# Patient Record
Sex: Female | Born: 1935
Health system: Southern US, Community
[De-identification: ages and names within clinical notes are randomized; demographics above are authoritative.]

## PROBLEM LIST (undated history)

## (undated) DIAGNOSIS — G8929 Other chronic pain: Secondary | ICD-10-CM

## (undated) DIAGNOSIS — M545 Other chronic pain: Secondary | ICD-10-CM

## (undated) DIAGNOSIS — M5430 Sciatica, unspecified side: Secondary | ICD-10-CM

---

## 2014-01-23 DIAGNOSIS — Z1231 Encounter for screening mammogram for malignant neoplasm of breast: Secondary | ICD-10-CM | POA: Diagnosis not present

## 2014-01-23 DIAGNOSIS — R920 Mammographic microcalcification found on diagnostic imaging of breast: Secondary | ICD-10-CM | POA: Diagnosis not present

## 2014-01-23 DIAGNOSIS — R922 Inconclusive mammogram: Secondary | ICD-10-CM | POA: Diagnosis not present

## 2014-06-06 DIAGNOSIS — Z961 Presence of intraocular lens: Secondary | ICD-10-CM | POA: Diagnosis not present

## 2014-06-06 DIAGNOSIS — H43811 Vitreous degeneration, right eye: Secondary | ICD-10-CM | POA: Diagnosis not present

## 2014-11-05 DIAGNOSIS — M810 Age-related osteoporosis without current pathological fracture: Secondary | ICD-10-CM | POA: Diagnosis not present

## 2014-11-05 DIAGNOSIS — Z0000 Encounter for general adult medical examination without abnormal findings: Secondary | ICD-10-CM | POA: Diagnosis not present

## 2014-11-05 DIAGNOSIS — Z681 Body mass index (BMI) 19 or less, adult: Secondary | ICD-10-CM | POA: Diagnosis not present

## 2014-11-05 DIAGNOSIS — E7800 Pure hypercholesterolemia, unspecified: Secondary | ICD-10-CM | POA: Diagnosis not present

## 2014-11-05 DIAGNOSIS — Z23 Encounter for immunization: Secondary | ICD-10-CM | POA: Diagnosis not present

## 2014-11-05 DIAGNOSIS — R030 Elevated blood-pressure reading, without diagnosis of hypertension: Secondary | ICD-10-CM | POA: Diagnosis not present

## 2014-12-17 DIAGNOSIS — Z961 Presence of intraocular lens: Secondary | ICD-10-CM | POA: Diagnosis not present

## 2014-12-17 DIAGNOSIS — H43811 Vitreous degeneration, right eye: Secondary | ICD-10-CM | POA: Diagnosis not present

## 2015-06-17 DIAGNOSIS — Z961 Presence of intraocular lens: Secondary | ICD-10-CM | POA: Diagnosis not present

## 2015-06-17 DIAGNOSIS — H43811 Vitreous degeneration, right eye: Secondary | ICD-10-CM | POA: Diagnosis not present

## 2015-10-29 DIAGNOSIS — Z23 Encounter for immunization: Secondary | ICD-10-CM | POA: Diagnosis not present

## 2015-11-06 DIAGNOSIS — J019 Acute sinusitis, unspecified: Secondary | ICD-10-CM | POA: Diagnosis not present

## 2015-11-06 DIAGNOSIS — J3089 Other allergic rhinitis: Secondary | ICD-10-CM | POA: Diagnosis not present

## 2016-03-23 DIAGNOSIS — E785 Hyperlipidemia, unspecified: Secondary | ICD-10-CM | POA: Diagnosis not present

## 2016-03-23 DIAGNOSIS — Z1211 Encounter for screening for malignant neoplasm of colon: Secondary | ICD-10-CM | POA: Diagnosis not present

## 2016-03-23 DIAGNOSIS — M810 Age-related osteoporosis without current pathological fracture: Secondary | ICD-10-CM | POA: Diagnosis not present

## 2016-03-23 DIAGNOSIS — Z1389 Encounter for screening for other disorder: Secondary | ICD-10-CM | POA: Diagnosis not present

## 2016-03-23 DIAGNOSIS — Z0000 Encounter for general adult medical examination without abnormal findings: Secondary | ICD-10-CM | POA: Diagnosis not present

## 2016-04-02 DIAGNOSIS — Z1211 Encounter for screening for malignant neoplasm of colon: Secondary | ICD-10-CM | POA: Diagnosis not present

## 2016-04-05 DIAGNOSIS — M9905 Segmental and somatic dysfunction of pelvic region: Secondary | ICD-10-CM | POA: Diagnosis not present

## 2016-04-05 DIAGNOSIS — M5441 Lumbago with sciatica, right side: Secondary | ICD-10-CM | POA: Diagnosis not present

## 2016-04-05 DIAGNOSIS — M25551 Pain in right hip: Secondary | ICD-10-CM | POA: Diagnosis not present

## 2016-04-05 DIAGNOSIS — M9904 Segmental and somatic dysfunction of sacral region: Secondary | ICD-10-CM | POA: Diagnosis not present

## 2016-04-05 DIAGNOSIS — M9903 Segmental and somatic dysfunction of lumbar region: Secondary | ICD-10-CM | POA: Diagnosis not present

## 2016-05-07 DIAGNOSIS — M79604 Pain in right leg: Secondary | ICD-10-CM | POA: Diagnosis not present

## 2016-05-07 DIAGNOSIS — M791 Myalgia: Secondary | ICD-10-CM | POA: Diagnosis not present

## 2016-05-15 ENCOUNTER — Observation Stay (HOSPITAL_COMMUNITY): Payer: Medicare Other

## 2016-05-15 ENCOUNTER — Other Ambulatory Visit (HOSPITAL_COMMUNITY): Payer: Medicare Other

## 2016-05-15 ENCOUNTER — Encounter (HOSPITAL_COMMUNITY): Payer: Self-pay | Admitting: Emergency Medicine

## 2016-05-15 ENCOUNTER — Emergency Department (HOSPITAL_COMMUNITY): Payer: Medicare Other

## 2016-05-15 ENCOUNTER — Observation Stay (HOSPITAL_COMMUNITY)
Admission: EM | Admit: 2016-05-15 | Discharge: 2016-05-16 | Disposition: A | Discharge status: Home / Self Care | Payer: Medicare Other | Attending: Nephrology | Admitting: Nephrology

## 2016-05-15 DIAGNOSIS — M545 Low back pain: Secondary | ICD-10-CM | POA: Insufficient documentation

## 2016-05-15 DIAGNOSIS — R4182 Altered mental status, unspecified: Secondary | ICD-10-CM | POA: Insufficient documentation

## 2016-05-15 DIAGNOSIS — E871 Hypo-osmolality and hyponatremia: Secondary | ICD-10-CM | POA: Diagnosis present

## 2016-05-15 DIAGNOSIS — R030 Elevated blood-pressure reading, without diagnosis of hypertension: Secondary | ICD-10-CM | POA: Diagnosis not present

## 2016-05-15 DIAGNOSIS — M5430 Sciatica, unspecified side: Secondary | ICD-10-CM

## 2016-05-15 DIAGNOSIS — Z882 Allergy status to sulfonamides status: Secondary | ICD-10-CM | POA: Diagnosis not present

## 2016-05-15 DIAGNOSIS — G8929 Other chronic pain: Secondary | ICD-10-CM | POA: Diagnosis not present

## 2016-05-15 DIAGNOSIS — I6621 Occlusion and stenosis of right posterior cerebral artery: Secondary | ICD-10-CM | POA: Diagnosis not present

## 2016-05-15 DIAGNOSIS — R4781 Slurred speech: Secondary | ICD-10-CM | POA: Diagnosis not present

## 2016-05-15 DIAGNOSIS — Z7982 Long term (current) use of aspirin: Secondary | ICD-10-CM | POA: Insufficient documentation

## 2016-05-15 DIAGNOSIS — G459 Transient cerebral ischemic attack, unspecified: Secondary | ICD-10-CM | POA: Diagnosis not present

## 2016-05-15 DIAGNOSIS — Z87891 Personal history of nicotine dependence: Secondary | ICD-10-CM | POA: Insufficient documentation

## 2016-05-15 DIAGNOSIS — I10 Essential (primary) hypertension: Secondary | ICD-10-CM | POA: Diagnosis present

## 2016-05-15 DIAGNOSIS — E785 Hyperlipidemia, unspecified: Secondary | ICD-10-CM | POA: Diagnosis not present

## 2016-05-15 DIAGNOSIS — R4701 Aphasia: Secondary | ICD-10-CM | POA: Diagnosis not present

## 2016-05-15 DIAGNOSIS — I639 Cerebral infarction, unspecified: Secondary | ICD-10-CM

## 2016-05-15 DIAGNOSIS — R29818 Other symptoms and signs involving the nervous system: Secondary | ICD-10-CM | POA: Diagnosis not present

## 2016-05-15 HISTORY — DX: Low back pain: M54.5

## 2016-05-15 HISTORY — DX: Sciatica, unspecified side: M54.30

## 2016-05-15 HISTORY — DX: Other chronic pain: M54.50

## 2016-05-15 HISTORY — DX: Other chronic pain: G89.29

## 2016-05-15 LAB — COMPREHENSIVE METABOLIC PANEL
ALT: 12 U/L — AB (ref 14–54)
ANION GAP: 12 (ref 5–15)
AST: 23 U/L (ref 15–41)
Albumin: 4.3 g/dL (ref 3.5–5.0)
Alkaline Phosphatase: 87 U/L (ref 38–126)
BUN: 11 mg/dL (ref 6–20)
CHLORIDE: 96 mmol/L — AB (ref 101–111)
CO2: 23 mmol/L (ref 22–32)
Calcium: 9.4 mg/dL (ref 8.9–10.3)
Creatinine, Ser: 0.89 mg/dL (ref 0.44–1.00)
Glucose, Bld: 97 mg/dL (ref 65–99)
POTASSIUM: 4 mmol/L (ref 3.5–5.1)
SODIUM: 131 mmol/L — AB (ref 135–145)
Total Bilirubin: 0.4 mg/dL (ref 0.3–1.2)
Total Protein: 7.1 g/dL (ref 6.5–8.1)

## 2016-05-15 LAB — APTT: aPTT: 31 seconds (ref 24–36)

## 2016-05-15 LAB — CBC
HEMATOCRIT: 38.5 % (ref 36.0–46.0)
Hemoglobin: 13.2 g/dL (ref 12.0–15.0)
MCH: 32.1 pg (ref 26.0–34.0)
MCHC: 34.3 g/dL (ref 30.0–36.0)
MCV: 93.7 fL (ref 78.0–100.0)
PLATELETS: 310 10*3/uL (ref 150–400)
RBC: 4.11 MIL/uL (ref 3.87–5.11)
RDW: 14 % (ref 11.5–15.5)
WBC: 7.9 10*3/uL (ref 4.0–10.5)

## 2016-05-15 LAB — I-STAT CHEM 8, ED
BUN: 13 mg/dL (ref 6–20)
CALCIUM ION: 1.06 mmol/L — AB (ref 1.15–1.40)
CHLORIDE: 96 mmol/L — AB (ref 101–111)
Creatinine, Ser: 0.9 mg/dL (ref 0.44–1.00)
GLUCOSE: 97 mg/dL (ref 65–99)
HCT: 41 % (ref 36.0–46.0)
Hemoglobin: 13.9 g/dL (ref 12.0–15.0)
Potassium: 3.9 mmol/L (ref 3.5–5.1)
Sodium: 130 mmol/L — ABNORMAL LOW (ref 135–145)
TCO2: 27 mmol/L (ref 0–100)

## 2016-05-15 LAB — LIPID PANEL
Cholesterol: 269 mg/dL — ABNORMAL HIGH (ref 0–200)
HDL: 76 mg/dL (ref >40)
LDL CALC: 169 mg/dL — AB (ref 0–99)
Total CHOL/HDL Ratio: 3.5 RATIO
Triglycerides: 120 mg/dL (ref <150)
VLDL: 24 mg/dL (ref 0–40)

## 2016-05-15 LAB — DIFFERENTIAL
BASOS PCT: 1 %
Basophils Absolute: 0 10*3/uL (ref 0.0–0.1)
EOS ABS: 0.1 10*3/uL (ref 0.0–0.7)
EOS PCT: 2 %
Lymphocytes Relative: 30 %
Lymphs Abs: 2.4 10*3/uL (ref 0.7–4.0)
MONO ABS: 0.6 10*3/uL (ref 0.1–1.0)
MONOS PCT: 8 %
Neutro Abs: 4.7 10*3/uL (ref 1.7–7.7)
Neutrophils Relative %: 59 %

## 2016-05-15 LAB — PROTIME-INR
INR: 1.02
PROTHROMBIN TIME: 13.4 s (ref 11.4–15.2)

## 2016-05-15 LAB — I-STAT TROPONIN, ED: TROPONIN I, POC: 0 ng/mL (ref 0.00–0.08)

## 2016-05-15 LAB — CBG MONITORING, ED: Glucose-Capillary: 96 mg/dL (ref 65–99)

## 2016-05-15 MED ORDER — SENNOSIDES-DOCUSATE SODIUM 8.6-50 MG PO TABS
1.0000 | ORAL_TABLET | Freq: Every evening | ORAL | Status: DC | PRN
  Filled 2016-05-15: qty 1

## 2016-05-15 MED ORDER — ACETAMINOPHEN 325 MG PO TABS
650.0000 mg | ORAL_TABLET | ORAL | Status: DC | PRN
  Administered 2016-05-16: 650 mg via ORAL
  Filled 2016-05-15: qty 2

## 2016-05-15 MED ORDER — ASPIRIN 300 MG RE SUPP: 300.0000 mg | Freq: Every day | RECTAL | Status: DC

## 2016-05-15 MED ORDER — ENOXAPARIN SODIUM 40 MG/0.4ML ~~LOC~~ SOLN
40.0000 mg | SUBCUTANEOUS | Status: DC
  Administered 2016-05-15: 40 mg via SUBCUTANEOUS
  Filled 2016-05-15 (×2): qty 0.4

## 2016-05-15 MED ORDER — ACETAMINOPHEN 650 MG RE SUPP: 650.0000 mg | RECTAL | Status: DC | PRN

## 2016-05-15 MED ORDER — ACETAMINOPHEN 160 MG/5ML PO SOLN: 650.0000 mg | ORAL | Status: DC | PRN

## 2016-05-15 MED ORDER — SODIUM CHLORIDE 0.9 % IV SOLN
INTRAVENOUS | Status: AC
  Administered 2016-05-15: 15:00:00 via INTRAVENOUS

## 2016-05-15 MED ORDER — STROKE: EARLY STAGES OF RECOVERY BOOK
Freq: Once | Status: DC
  Filled 2016-05-15: qty 1

## 2016-05-15 MED ORDER — ASPIRIN 325 MG PO TABS
325.0000 mg | ORAL_TABLET | Freq: Every day | ORAL | Status: DC
  Administered 2016-05-15 – 2016-05-16 (×2): 325 mg via ORAL
  Filled 2016-05-15 (×2): qty 1

## 2016-05-15 NOTE — Consult Note (Signed)
Neurology Consultation Reason for Consult: Aphasia Referring Physician: Rudean Haskell  CC: Aphasia  History is obtained from: Patient  HPI: Erica House is a 81 y.o. female who was reading a book round 11:30. She then noticed that she was having difficulty with reading. She knows the 23rd psalm by heart and tried to recite it and felt that she knew what needed to come next but was unable to get the words out. Due to this, she called 911 and had difficulty with speaking. This has been steadily improving since that time, and she feels like she is almost back to baseline at this point.   LKW: 11:30 AM tpa given?: no, rapidly improving mild symptoms  ROS: A 14 point ROS was performed and is negative except as noted in the HPI.   Past Medical History:  Diagnosis Date  . Chronic bilateral low back pain   . Sciatica      History reviewed. No pertinent family history.   Social History:  reports that she has quit smoking. She has quit using smokeless tobacco. She reports that she does not drink alcohol or use drugs.   Exam: Current vital signs: BP (!) 188/82   Pulse 72   Temp 97.8 F (36.6 C)   Resp 17   Ht 5\' 2"  (1.575 m)   Wt 49.7 kg (109 lb 8 oz)   SpO2 100%   BMI 20.03 kg/m  Vital signs in last 24 hours: Temp:  [97.8 F (36.6 C)] 97.8 F (36.6 C) (04/28 1403) Pulse Rate:  [68-82] 72 (04/28 1430) Resp:  [15-20] 17 (04/28 1430) BP: (152-188)/(75-87) 188/82 (04/28 1430) SpO2:  [98 %-100 %] 100 % (04/28 1430) Weight:  [49.7 kg (109 lb 8 oz)] 49.7 kg (109 lb 8 oz) (04/28 1328)   Physical Exam  Constitutional: Appears well-developed and well-nourished.  Psych: Affect appropriate to situation Eyes: No scleral injection HENT: No OP obstrucion Head: Normocephalic.  Cardiovascular: Normal rate and regular rhythm.  Respiratory: Effort normal and breath sounds normal to anterior ascultation GI: Soft.  No distension. There is no tenderness.  Skin: WDI  Neuro: Mental  Status: Patient is awake, alert, oriented to person, place, month, year, and situation. Patient is able to give a clear and coherent history. No signs of aphasia or neglect Cranial Nerves: II: Visual Fields are full. Pupils are equal, round, and reactive to light.   III,IV, VI: EOMI without ptosis or diploplia.  V: Facial sensation is symmetric to temperature VII: Facial movement is symmetric.  VIII: hearing is intact to voice X: Uvula elevates symmetrically XI: Shoulder shrug is symmetric. XII: tongue is midline without atrophy or fasciculations.  Motor: Tone is normal. Bulk is normal. 5/5 strength was present in all four extremities.  Sensory: Sensation is symmetric to light touch and temperature in the arms and legs. Cerebellar: FNF intact bilaterally  I have reviewed labs in epic and the results pertinent to this consultation are: Sodium 131  I have reviewed the images obtained: CT head-unremarkable  Impression: 81 year old female with transient difficulty reading, aphasia. The character of the symptoms are highly concerning for a transient ischemic attack and I would favor admitting her for workup for such.  Recommendations: 1. HgbA1c, fasting lipid panel 2. MRI, MRA  of the brain without contrast 3. Frequent neuro checks 4. Echocardiogram 5. Carotid dopplers 6. Prophylactic therapy-Antiplatelet med: Aspirin - dose 325mg  PO or 300mg  PR 7. Risk factor modification 8. Telemetry monitoring 9. PT consult, OT consult, Speech consult 10.  please page stroke NP  Or  PA  Or MD  from 8am -4 pm as this patient will be followed by the stroke team at this point.   You can look them up on [email or web address]      Roland Rack, MD [facility] [phone]  If 7pm- 7am, please page neurology on call as listed in [place].

## 2016-05-15 NOTE — ED Provider Notes (Signed)
[facility] DEPT Provider Note   CSN: [id number] Arrival date & time: 05/15/16  1304     History   Chief Complaint Chief Complaint  Patient presents with  . Code Stroke    HPI Kimora Stankovic is a 81 y.o. female.  The history is provided by the patient and a relative. No language interpreter was used.   Mateja Dier is a 81 y.o. female who presents to the Emergency Department complaining of Code stroke. History is provided by patient, family, EMS. She was in her routine state of health this morning.  She was last seen normal at 11:30 this morning. She went to try and read something and had difficulty understanding how to read and had difficulty speaking as well. EMS was called for concern of stroke. She had no weakness but did feel tingling all over. She feels like she may develop a headache but does not currently have a headache. No prior similar symptoms. No history of TIA or CVA.  Overall her speech and reading difficulties have improved.  Past Medical History:  Diagnosis Date  . Chronic bilateral low back pain   . Sciatica     Patient Active Problem List   Diagnosis Date Noted  . TIA (transient ischemic attack) 05/15/2016  . Hyponatremia 05/15/2016  . Hypertension 05/15/2016  . Sciatica     History reviewed. No pertinent surgical history.  OB History    No data available       Home Medications    Prior to Admission medications   Medication Sig Start Date End Date Taking? Authorizing Provider  acetaminophen (TYLENOL) 325 MG tablet Take 650 mg by mouth every 6 (six) hours as needed for mild pain.   Yes Historical Provider, MD  Ascorbic Acid (VITAMIN C) 1000 MG tablet Take 1,000 mg by mouth daily.   Yes Historical Provider, MD  Biotin 5000 MCG CAPS Take 5,000 mcg by mouth daily.   Yes Historical Provider, MD  CALCIUM-VITAMIN D PO Take 1 tablet by mouth daily.   Yes Historical Provider, MD  docusate sodium (COLACE) 100 MG capsule Take 100 mg by mouth at bedtime.    Yes Historical Provider, MD  loratadine (CLARITIN) 10 MG tablet Take 10 mg by mouth daily.   Yes Historical Provider, MD  naproxen sodium (ANAPROX) 220 MG tablet Take 220 mg by mouth 2 (two) times daily as needed (pain).   Yes Historical Provider, MD  vitamin E 400 UNIT capsule Take 400 Units by mouth daily.   Yes Historical Provider, MD    Family History History reviewed. No pertinent family history.  Social History Social History  Substance Use Topics  . Smoking status: Former Research scientist (life sciences)  . Smokeless tobacco: Former Systems developer  . Alcohol use No     Allergies   Azithromycin; Erythromycin; Macrodantin [nitrofurantoin macrocrystal]; and Sulfa antibiotics   Review of Systems Review of Systems  All other systems reviewed and are negative.    Physical Exam Updated Vital Signs BP (!) 188/82   Pulse 72   Temp 97.8 F (36.6 C)   Resp 17   Ht 5\' 2"  (1.575 m)   Wt 109 lb 8 oz (49.7 kg)   SpO2 100%   BMI 20.03 kg/m   Physical Exam  Constitutional: She is oriented to person, place, and time. She appears well-developed and well-nourished.  HENT:  Head: Normocephalic and atraumatic.  Eyes: EOM are normal. Pupils are equal, round, and reactive to light.  Cardiovascular: Normal rate and regular rhythm.  No murmur heard. Pulmonary/Chest: Effort normal and breath sounds normal. No respiratory distress.  Abdominal: Soft. There is no tenderness. There is no rebound and no guarding.  Musculoskeletal: She exhibits no edema or tenderness.  Neurological: She is alert and oriented to person, place, and time. No cranial nerve deficit. Coordination normal.  5 out of 5 strength in all 4 extremities with sensation light touch intact in all 4 extremities. No pronator drift.  Skin: Skin is warm and dry.  Psychiatric: She has a normal mood and affect. Her behavior is normal.  Nursing note and vitals reviewed.    ED Treatments / Results  Labs (all labs ordered are listed, but only abnormal  results are displayed) Labs Reviewed  COMPREHENSIVE METABOLIC PANEL - Abnormal; Notable for the following:       Result Value   Sodium 131 (*)    Chloride 96 (*)    ALT 12 (*)    All other components within normal limits  LIPID PANEL - Abnormal; Notable for the following:    Cholesterol 269 (*)    LDL Cholesterol 169 (*)    All other components within normal limits  I-STAT CHEM 8, ED - Abnormal; Notable for the following:    Sodium 130 (*)    Chloride 96 (*)    Calcium, Ion 1.06 (*)    All other components within normal limits  PROTIME-INR  APTT  CBC  DIFFERENTIAL  HEMOGLOBIN A1C  I-STAT TROPOININ, ED  CBG MONITORING, ED    EKG  EKG Interpretation None       Radiology Mr Brain Wo Contrast  Result Date: 05/15/2016 CLINICAL DATA:  Acute onset of hand weakness and speech disturbance. Negative CT. EXAM: MRI HEAD WITHOUT CONTRAST MRA HEAD WITHOUT CONTRAST TECHNIQUE: Multiplanar, multiecho pulse sequences of the brain and surrounding structures were obtained without intravenous contrast. Angiographic images of the head were obtained using MRA technique without contrast. COMPARISON:  CT same day FINDINGS: MRI HEAD FINDINGS Brain: Diffusion imaging does not show any acute or subacute infarction. The brainstem and cerebellum are normal. Cerebral hemispheres show moderate chronic small-vessel ischemic changes of the deep and subcortical white matter. No cortical or large vessel territory infarction. No mass lesion, hemorrhage, hydrocephalus or extra-axial collection. Vascular: Major vessels at the base of the brain show flow. Skull and upper cervical spine: Negative Sinuses/Orbits: Clear/ normal.  Previous intra-ocular lens implants. Other: None MRA HEAD FINDINGS Both internal carotid artery's are widely patent through the skullbase and siphon regions. No stenosis. The anterior and middle cerebral vessels are patent without proximal stenosis, aneurysm or vascular malformation. More distal  MCA branches show some atherosclerotic irregularity. Both vertebral arteries are widely patent to the basilar. No basilar stenosis. There is 50% stenosis of the right P1 segment. More distal PCA branches show atherosclerotic narrowing and irregularity. IMPRESSION: No acute infarction. Moderate chronic small-vessel ischemic changes of the cerebral hemispheric white matter. 50% right P1 stenosis. Distal vessel atherosclerotic irregularity particularly evident in the PCA more than MCA territories. Electronically Signed   By: Nelson Chimes M.D.   On: 05/15/2016 15:53   Mr Jodene Nam Head/brain PI Cm  Result Date: 05/15/2016 CLINICAL DATA:  Acute onset of hand weakness and speech disturbance. Negative CT. EXAM: MRI HEAD WITHOUT CONTRAST MRA HEAD WITHOUT CONTRAST TECHNIQUE: Multiplanar, multiecho pulse sequences of the brain and surrounding structures were obtained without intravenous contrast. Angiographic images of the head were obtained using MRA technique without contrast. COMPARISON:  CT same day FINDINGS: MRI HEAD  FINDINGS Brain: Diffusion imaging does not show any acute or subacute infarction. The brainstem and cerebellum are normal. Cerebral hemispheres show moderate chronic small-vessel ischemic changes of the deep and subcortical white matter. No cortical or large vessel territory infarction. No mass lesion, hemorrhage, hydrocephalus or extra-axial collection. Vascular: Major vessels at the base of the brain show flow. Skull and upper cervical spine: Negative Sinuses/Orbits: Clear/ normal.  Previous intra-ocular lens implants. Other: None MRA HEAD FINDINGS Both internal carotid artery's are widely patent through the skullbase and siphon regions. No stenosis. The anterior and middle cerebral vessels are patent without proximal stenosis, aneurysm or vascular malformation. More distal MCA branches show some atherosclerotic irregularity. Both vertebral arteries are widely patent to the basilar. No basilar stenosis.  There is 50% stenosis of the right P1 segment. More distal PCA branches show atherosclerotic narrowing and irregularity. IMPRESSION: No acute infarction. Moderate chronic small-vessel ischemic changes of the cerebral hemispheric white matter. 50% right P1 stenosis. Distal vessel atherosclerotic irregularity particularly evident in the PCA more than MCA territories. Electronically Signed   By: Nelson Chimes M.D.   On: 05/15/2016 15:53   Ct Head Code Stroke W/o Cm  Result Date: 05/15/2016 CLINICAL DATA:  Code stroke.  Aphasia EXAM: CT HEAD WITHOUT CONTRAST TECHNIQUE: Contiguous axial images were obtained from the base of the skull through the vertex without intravenous contrast. COMPARISON:  None. FINDINGS: Brain: No evidence of acute infarction, hemorrhage, hydrocephalus, extra-axial collection or mass lesion/mass effect. Mild atrophy, not unexpected for age. Patchy hypoattenuation of white matter, likely small vessel disease. Vascular: Vascular calcification affecting the skull base carotid arteries and distal vertebrals. No signs of emergent large vessel occlusion. Skull: Normal. Negative for fracture or focal lesion. Sinuses/Orbits: No acute finding. Other: None. ASPECTS [facility] Stroke Program Early CT Score) - Ganglionic level infarction (caudate, lentiform nuclei, internal capsule, insula, M1-M3 cortex): 7 - Supraganglionic infarction (M4-M6 cortex): 3 Total score (0-10 with 10 being normal): 10 IMPRESSION: 1. Mild atrophy.  No acute intracranial findings. 2. ASPECTS is 10. These results were called by telephone at the time of interpretation on 05/15/2016 at 1:24 pm to Dr. Leonel Ramsay , who verbally acknowledged these results. Electronically Signed   By: Staci Righter M.D.   On: 05/15/2016 13:27    Procedures Procedures (including critical care time)  Medications Ordered in ED Medications   stroke: mapping our early stages of recovery book (not administered)  0.9 %  sodium chloride infusion (not  administered)  acetaminophen (TYLENOL) tablet 650 mg (not administered)    Or  acetaminophen (TYLENOL) solution 650 mg (not administered)    Or  acetaminophen (TYLENOL) suppository 650 mg (not administered)  senna-docusate (Senokot-S) tablet 1 tablet (not administered)  enoxaparin (LOVENOX) injection 40 mg (not administered)  aspirin suppository 300 mg (not administered)    Or  aspirin tablet 325 mg (not administered)     Initial Impression / Assessment and Plan / ED Course  I have reviewed the triage vital signs and the nursing notes.  Pertinent labs & imaging results that were available during my care of the patient were reviewed by me and considered in my medical decision making (see chart for details).     Patient presented as a code stroke for expressive aphasia. On examination in the emergency department her symptoms have improved/resolved. She has been a violated by neurology. She is not a TPA candidate given the rapid resolution of her symptoms. Plan to admit for observation for further workup of TIA. Hospitalist consulted  for admission.  Final Clinical Impressions(s) / ED Diagnoses   Final diagnoses:  Transient cerebral ischemia, unspecified type    New Prescriptions New Prescriptions   No medications on file     Quintella Reichert, MD 05/16/16 1333

## 2016-05-15 NOTE — Progress Notes (Signed)
Arrived from ED via wheelchair; just completed MRI; patient is alert and oriented; daughter is at bedside; oriented patient and family member to unit and unit routine.

## 2016-05-15 NOTE — ED Notes (Signed)
Got patient undressed on the monitor patient family is at bedside patient is resting

## 2016-05-15 NOTE — ED Notes (Signed)
Checked patient cbg it was 47 let RN know about the blood sugar

## 2016-05-15 NOTE — H&P (Signed)
History and Physical    Erica House WUX:[id number] DOB: [birth year] DOA: 05/15/2016  PCP: Pcp Not In System Patient coming from: home  Chief Complaint: tia  HPI: Erica House is a 81 y.o. female with medical history significant for chronic low back pain presents to the emergency Department chief complaint altered mental status. Initial evaluation/presentation concerning for TIA.  Information is obtained from the patient and the daughter who is at the bedside. She was in her usual state of health until this morning she suddenly states she was trying to reach for something and was unable to grab it. She also indicates she was reading the paper and had difficulty understanding it. She tried to speak and she reports the words were "in there but I couldn't get them out". EMS was called and code stroke was initiated. She denies headache dizziness visual disturbances numbness or weakness. She does describe a generalized tingling all over. She denies chest pain palpitation shortness of breath diaphoresis. She denies abdominal pain nausea vomiting diarrhea constipation melena. She denies dysuria hematuria frequency or urgency. She denies fever chills recent travel or sick contacts.   ED Course: In the emergency department she's afebrile hemodynamically stable and not hypoxic. She is evaluated by neurology recommends admission for TIA workup  Review of Systems: As per HPI otherwise 10 point review of systems negative.   Ambulatory Status: Ambulates independently no recent falls  Past Medical History:  Diagnosis Date  . Chronic bilateral low back pain   . Sciatica     History reviewed. No pertinent surgical history.  Social History   Social History  . Marital status: Widowed    Spouse name: N/A  . Number of children: N/A  . Years of education: N/A   Occupational History  . Not on file.   Social History Main Topics  . Smoking status: Former Research scientist (life sciences)  . Smokeless tobacco: Former Systems developer  .  Alcohol use No  . Drug use: No  . Sexual activity: Not on file   Other Topics Concern  . Not on file   Social History Narrative  . No narrative on file   She lives at home alone. She is retired Agricultural consultant. Allergies  Allergen Reactions  . Azithromycin Diarrhea  . Erythromycin Diarrhea  . Macrodantin [Nitrofurantoin Macrocrystal] Diarrhea  . Sulfa Antibiotics Diarrhea    History reviewed. No pertinent family history.  Prior to Admission medications   Medication Sig Start Date End Date Taking? Authorizing Provider  acetaminophen (TYLENOL) 325 MG tablet Take 650 mg by mouth every 6 (six) hours as needed for mild pain.   Yes Historical Provider, MD  Ascorbic Acid (VITAMIN C) 1000 MG tablet Take 1,000 mg by mouth daily.   Yes Historical Provider, MD  Biotin 5000 MCG CAPS Take 5,000 mcg by mouth daily.   Yes Historical Provider, MD  CALCIUM-VITAMIN D PO Take 1 tablet by mouth daily.   Yes Historical Provider, MD  docusate sodium (COLACE) 100 MG capsule Take 100 mg by mouth at bedtime.   Yes Historical Provider, MD  loratadine (CLARITIN) 10 MG tablet Take 10 mg by mouth daily.   Yes Historical Provider, MD  naproxen sodium (ANAPROX) 220 MG tablet Take 220 mg by mouth 2 (two) times daily as needed (pain).   Yes Historical Provider, MD  vitamin E 400 UNIT capsule Take 400 Units by mouth daily.   Yes Historical Provider, MD    Physical Exam: Vitals:   05/15/16 1400 05/15/16 1403 05/15/16 1415 05/15/16 1430  BP: (!) 184/79  (!) 183/75 (!) 188/82  Pulse: 74  68 72  Resp: 15  18 17   Temp:  97.8 F (36.6 C)    TempSrc:      SpO2: 100%  98% 100%  Weight:      Height:         General:  Appears calm and comfortable No acute distress Eyes:  PERRL, EOMI, normal lids, iris ENT:  grossly normal hearing, lips & tongue, mucous membranes of her mouth are pink slightly dry Neck:  no LAD, masses or thyromegaly Cardiovascular:  RRR, no m/r/g. No LE edema. Pedal pulses present and  palpable Respiratory:  CTA bilaterally, no w/r/r. Normal respiratory effort. Abdomen:  soft, ntnd, positive bowel sounds throughout Skin:  no rash or induration seen on limited exam Musculoskeletal:  grossly normal tone BUE/BLE, good ROM, no bony abnormality Psychiatric:  grossly normal mood and affect, speech fluent and appropriate, AOx3 Neurologic:  CN 2-12 grossly intact, moves all extremities in coordinated fashion, sensation intact alert and oriented 3 bilateral grip 5 out of 5 lower extremity strength bilaterally 5 out of 5 no pronator drift tongue midline speech clear  Labs on Admission: I have personally reviewed following labs and imaging studies  CBC:  Recent Labs Lab 05/15/16 1307 05/15/16 1313  WBC 7.9  --   NEUTROABS 4.7  --   HGB 13.2 13.9  HCT 38.5 41.0  MCV 93.7  --   PLT 310  --    Basic Metabolic Panel:  Recent Labs Lab 05/15/16 1307 05/15/16 1313  NA 131* 130*  K 4.0 3.9  CL 96* 96*  CO2 23  --   GLUCOSE 97 97  BUN 11 13  CREATININE 0.89 0.90  CALCIUM 9.4  --    GFR: Estimated Creatinine Clearance: 40.4 mL/min (by C-G formula based on SCr of 0.9 mg/dL). Liver Function Tests:  Recent Labs Lab 05/15/16 1307  AST 23  ALT 12*  ALKPHOS 87  BILITOT 0.4  PROT 7.1  ALBUMIN 4.3   No results for input(s): LIPASE, AMYLASE in the last 168 hours. No results for input(s): AMMONIA in the last 168 hours. Coagulation Profile:  Recent Labs Lab 05/15/16 1307  INR 1.02   Cardiac Enzymes: No results for input(s): CKTOTAL, CKMB, CKMBINDEX, TROPONINI in the last 168 hours. BNP (last 3 results) No results for input(s): PROBNP in the last 8760 hours. HbA1C: No results for input(s): HGBA1C in the last 72 hours. CBG:  Recent Labs Lab 05/15/16 1327  GLUCAP 96   Lipid Profile: No results for input(s): CHOL, HDL, LDLCALC, TRIG, CHOLHDL, LDLDIRECT in the last 72 hours. Thyroid Function Tests: No results for input(s): TSH, T4TOTAL, FREET4, T3FREE,  THYROIDAB in the last 72 hours. Anemia Panel: No results for input(s): VITAMINB12, FOLATE, FERRITIN, TIBC, IRON, RETICCTPCT in the last 72 hours. Urine analysis: No results found for: COLORURINE, APPEARANCEUR, LABSPEC, PHURINE, GLUCOSEU, HGBUR, BILIRUBINUR, KETONESUR, PROTEINUR, UROBILINOGEN, NITRITE, LEUKOCYTESUR  Creatinine Clearance: Estimated Creatinine Clearance: 40.4 mL/min (by C-G formula based on SCr of 0.9 mg/dL).  Sepsis Labs: @LABRCNTIP (procalcitonin:4,lacticidven:4) )No results found for this or any previous visit (from the past 240 hour(s)).   Radiological Exams on Admission: Ct Head Code Stroke W/o Cm  Result Date: 05/15/2016 CLINICAL DATA:  Code stroke.  Aphasia EXAM: CT HEAD WITHOUT CONTRAST TECHNIQUE: Contiguous axial images were obtained from the base of the skull through the vertex without intravenous contrast. COMPARISON:  None. FINDINGS: Brain: No evidence of acute infarction, hemorrhage, hydrocephalus, extra-axial  collection or mass lesion/mass effect. Mild atrophy, not unexpected for age. Patchy hypoattenuation of white matter, likely small vessel disease. Vascular: Vascular calcification affecting the skull base carotid arteries and distal vertebrals. No signs of emergent large vessel occlusion. Skull: Normal. Negative for fracture or focal lesion. Sinuses/Orbits: No acute finding. Other: None. ASPECTS [facility] Stroke Program Early CT Score) - Ganglionic level infarction (caudate, lentiform nuclei, internal capsule, insula, M1-M3 cortex): 7 - Supraganglionic infarction (M4-M6 cortex): 3 Total score (0-10 with 10 being normal): 10 IMPRESSION: 1. Mild atrophy.  No acute intracranial findings. 2. ASPECTS is 10. These results were called by telephone at the time of interpretation on 05/15/2016 at 1:24 pm to Dr. Leonel Ramsay , who verbally acknowledged these results. Electronically Signed   By: Staci Righter M.D.   On: 05/15/2016 13:27    EKG: Independently reviewed. Sinus rhythm   Borderline short PR interval  Assessment/Plan Principal Problem:   TIA (transient ischemic attack) Active Problems:   Hyponatremia   Hypertension   #1. TIA. Back at baseline on admission. No obvious risk factors. CT of the head without acute abnormalities -Admit to telemetry -MRI MRA of the brain -Carotid Dopplers -2-D echo -Lipid panel and hemoglobin A1c -Aspirin -Consider a statin -PT and OT -Heart healthy diet once she has cleared bedside swallow eval -await neuro recommendations once results of workup  #2. Hyponatremia. Mild. Sodium level 130. No history of same. -Gentle IV fluids -Recheck in the morning  #3. Hypertension. No history of same. Blood pressure 183/75. -Monitor -allow for permissive HTN    DVT prophylaxis: lovenox  Code Status: full  Family Communication: daughter at bedside  Disposition Plan: home  Consults called: kirkpatrick  Admission status: obs    Dyanne Carrel M MD [facility]  If 7PM-7AM, please contact night-coverage [email or web address] Password [email or web address]  05/15/2016, 2:40 PM

## 2016-05-15 NOTE — ED Notes (Signed)
Pt returned from MRI °

## 2016-05-16 ENCOUNTER — Observation Stay (HOSPITAL_BASED_OUTPATIENT_CLINIC_OR_DEPARTMENT_OTHER): Payer: Medicare Other

## 2016-05-16 DIAGNOSIS — I10 Essential (primary) hypertension: Secondary | ICD-10-CM | POA: Diagnosis not present

## 2016-05-16 DIAGNOSIS — G459 Transient cerebral ischemic attack, unspecified: Secondary | ICD-10-CM

## 2016-05-16 DIAGNOSIS — G458 Other transient cerebral ischemic attacks and related syndromes: Secondary | ICD-10-CM

## 2016-05-16 LAB — BASIC METABOLIC PANEL
Anion gap: 7 (ref 5–15)
BUN: 7 mg/dL (ref 6–20)
CHLORIDE: 102 mmol/L (ref 101–111)
CO2: 27 mmol/L (ref 22–32)
CREATININE: 0.74 mg/dL (ref 0.44–1.00)
Calcium: 8.9 mg/dL (ref 8.9–10.3)
GFR calc Af Amer: >60 mL/min (ref >60)
GFR calc non Af Amer: >60 mL/min (ref >60)
GLUCOSE: 88 mg/dL (ref 65–99)
Potassium: 4.6 mmol/L (ref 3.5–5.1)
SODIUM: 136 mmol/L (ref 135–145)

## 2016-05-16 LAB — VAS US CAROTID
LCCADDIAS: -17 cm/s
LCCADSYS: -89 cm/s
LEFT ECA DIAS: -5 cm/s
LEFT VERTEBRAL DIAS: -18 cm/s
LICAPDIAS: 12 cm/s
Left CCA prox dias: 22 cm/s
Left CCA prox sys: 111 cm/s
Left ICA dist dias: -24 cm/s
Left ICA dist sys: -85 cm/s
Left ICA prox sys: 59 cm/s
RCCAPDIAS: 16 cm/s
RCCAPSYS: 91 cm/s
RIGHT ECA DIAS: -10 cm/s
RIGHT VERTEBRAL DIAS: -18 cm/s
Right cca dist sys: -133 cm/s

## 2016-05-16 LAB — ECHOCARDIOGRAM COMPLETE
Height: 62 in
WEIGHTICAEL: 1752 [oz_av]

## 2016-05-16 LAB — HEMOGLOBIN A1C
Hgb A1c MFr Bld: 5.4 % (ref 4.8–5.6)
Mean Plasma Glucose: 108 mg/dL

## 2016-05-16 MED ORDER — ROSUVASTATIN CALCIUM 5 MG PO TABS: 5.0000 mg | ORAL_TABLET | Freq: Every day | ORAL | 0 refills | Status: DC

## 2016-05-16 MED ORDER — ATORVASTATIN CALCIUM 40 MG PO TABS: 40.0000 mg | ORAL_TABLET | Freq: Every day | ORAL | Status: DC

## 2016-05-16 MED ORDER — ASPIRIN EC 81 MG PO TBEC: 81.0000 mg | DELAYED_RELEASE_TABLET | Freq: Every day | ORAL | 0 refills | Status: AC

## 2016-05-16 NOTE — Progress Notes (Signed)
STROKE TEAM PROGRESS NOTE   HISTORY OF PRESENT ILLNESS (per record) Erica House is a 81 y.o. female who was reading a book round 11:30. She then noticed that she was having difficulty with reading. She knows the 23rd psalm by heart and tried to recite it and felt that she knew what needed to come next but was unable to get the words out. Due to this, she called 911 and had difficulty with speaking. This has been steadily improving since that time, and she feels like she is almost back to baseline at this point.  LKW: 11:30 AM tpa given?: no, rapidly improving mild symptoms   SUBJECTIVE (INTERVAL HISTORY) Her husband is at the bedside.   She describes one hour episode of difficulty reading as well as speaking and getting words out as well as understanding but denies any focal weakness. She also had tingling and numbness involving both sides of the face and upper extremities. She denies any accompanying headache but did have some headache after the episode yesterday. She does have history of migraine headaches but headaches yesterday and not typical for migraines. She has no prior history of similar episodes.  The patient wants to go home and is refusing to stay to get an EEG study today but is willing to come back later and have it as an outpatient.   OBJECTIVE Temp:  [97 F (36.1 C)-98.5 F (36.9 C)] 97.9 F (36.6 C) (04/29 0600) Pulse Rate:  [62-77] 64 (04/29 0600) Cardiac Rhythm: Normal sinus rhythm (04/29 0700) Resp:  [17-20] 18 (04/29 0600) BP: (143-188)/(54-82) 143/54 (04/29 0600) SpO2:  [97 %-100 %] 97 % (04/29 0600)  CBC:   Recent Labs Lab 05/15/16 1307 05/15/16 1313  WBC 7.9  --   NEUTROABS 4.7  --   HGB 13.2 13.9  HCT 38.5 41.0  MCV 93.7  --   PLT 310  --     Basic Metabolic Panel:   Recent Labs Lab 05/15/16 1307 05/15/16 1313 05/16/16 0356  NA 131* 130* 136  K 4.0 3.9 4.6  CL 96* 96* 102  CO2 23  --  27  GLUCOSE 97 97 88  BUN 11 13 7   CREATININE 0.89  0.90 0.74  CALCIUM 9.4  --  8.9    Lipid Panel:     Component Value Date/Time   CHOL 269 (H) 05/15/2016 1307   TRIG 120 05/15/2016 1307   HDL 76 05/15/2016 1307   CHOLHDL 3.5 05/15/2016 1307   VLDL 24 05/15/2016 1307   LDLCALC 169 (H) 05/15/2016 1307   HgbA1c:  Lab Results  Component Value Date   HGBA1C 5.4 05/15/2016   Urine Drug Screen: No results found for: LABOPIA, COCAINSCRNUR, LABBENZ, AMPHETMU, THCU, LABBARB  Alcohol Level No results found for: [facility]  IMAGING  Mr Jodene Nam Head/brain Wo Cm 05/15/2016 No acute infarction. Moderate chronic small-vessel ischemic changes of the cerebral hemispheric white matter. 50% right P1 stenosis. Distal vessel atherosclerotic irregularity particularly evident in the PCA more than MCA territories.       Ct Head Code Stroke W/o Cm 05/15/2016 1. Mild atrophy.  No acute intracranial findings.  2. ASPECTS is 10.     PHYSICAL EXAM Frail pleasant elderly caucasian lady not in distress.  . Afebrile. Head is nontraumatic. Neck is supple without bruit.    Cardiac exam no murmur or gallop. Lungs are clear to auscultation. Distal pulses are well felt. Neurological Exam ;  Awake  Alert oriented x 3. Normal speech and language.eye movements  full without nystagmus.fundi were not visualized. Vision acuity and fields appear normal. Hearing is normal. Palatal movements are normal. Face symmetric. Tongue midline. Normal strength, tone, reflexes and coordination. Normal sensation. Gait deferred.      ASSESSMENT/PLAN Ms. Erica House is a 81 y.o. female with history of chronic low back pain presenting with speech difficulties. She did not receive IV t-PA due to rapidly improving deficits.  TIA:   Resultant - deficits resolved  MRI - no acute infarct  MRA - 50% right P1 stenosis.  Carotid Doppler - 1-39% ICA stenosis.  Vertebral artery flow is antegrade.   2D Echo - EF 50-55%. No cardiac source of emboli identified.  LDL - 169  HgbA1c -  5.4  VTE prophylaxis - Lovenox Diet Heart Room service appropriate? Yes; Fluid consistency: Thin Diet - low sodium heart healthy  No antithrombotic prior to admission, now on aspirin 325 mg daily  Patient counseled to be compliant with her antithrombotic medications  Ongoing aggressive stroke risk factor management  Therapy recommendations: No needs identified.  Disposition: Discharged  Hypertension  Stable  Permissive hypertension (OK if < 220/120) but gradually normalize in 5-7 days  Long-term BP goal normotensive  Hyperlipidemia  Home meds: No lipid lowering medications prior to admission  LDL 169, goal < 70  Now on Lipitor 40 mg daily  Continue statin at discharge   Other Stroke Risk Factors  Advanced age  Former smoker - quit   Other Active Problems  Hyponatremia -> resolved  Hospital day # 0  I have personally examined this patient, reviewed notes, independently viewed imaging studies, participated in medical decision making and plan of care.ROS completed by me personally and pertinent positives fully documented  I have made any additions or clarifications directly to the above note. She presented with transient episode of speech difficulty and breathing difficulty along with paresthesias in both upper extremities and face of unclear significance. TIA is possible though her typical description. Recommend she take aspirin 81 mg daily and start statin for her hyperlipidemia. Check EEG as an outpatient. Follow-up in the stroke clinic in 6 weeks. Long discussion about the bedside with the patient, husband and Dr. Carolin Sicks and answered questions. Greater than 50% time during this 35 minute visit was spent on counseling and coordination of care about her episode, and for further evaluation, treatment, follow-up and answered questions  Antony Contras, MD Medical Director [facility] Pager: [phone] 05/16/2016 3:04 PM   To contact Stroke Continuity  provider, please refer to [email or web address] After hours, contact General Neurology

## 2016-05-16 NOTE — Progress Notes (Signed)
  Echocardiogram 2D Echocardiogram has been performed.  Erica House 05/16/2016, 12:00 PM

## 2016-05-16 NOTE — Progress Notes (Signed)
Patient ready for discharge; discharge instructions given and reviewed; Rx's given; daughter present to accompany patient home; patient discharged.

## 2016-05-16 NOTE — Progress Notes (Signed)
PT Cancellation Note  Patient Details Name: Kimberlynn Lumbra MRN: [id number] DOB: 1935-01-19   Cancelled Treatment:    Reason Eval/Treat Not Completed: PT screened, no needs identified, will sign off (Pt independent.  No PT needs.) Will sign off.  Thanks.    Godfrey Pick Jahi Roza 05/16/2016, 10:09 AM [facility] [phone] (pager)

## 2016-05-16 NOTE — Progress Notes (Signed)
VASCULAR LAB PRELIMINARY  PRELIMINARY  PRELIMINARY  PRELIMINARY  Carotid duplex completed.    Preliminary report:  1-39% ICA stenosis.  Vertebral artery flow is antegrade.   Jamari Moten, RVT 05/16/2016, 9:07 AM

## 2016-05-16 NOTE — Discharge Summary (Signed)
Physician Discharge Summary  Erica House HYQ:[id number] DOB: 21-Apr-1935 DOA: 05/15/2016  PCP: Pcp Not In System  Admit date: 05/15/2016 Discharge date: 05/16/2016  Admitted From:home Disposition:home  Recommendations for Outpatient Follow-up:  1. Follow up with PCP in 1-2 weeks 2. Follow-up A1c result with her PCP.  Home Health:no Equipment/Devices:no Discharge Condition:stable CODE STATUS:full Diet recommendation:heart healthy  Brief/Interim Summary: 81 year old female with history of hypertension presented with sudden onset of speech and difficulty breathing. Patient's symptoms likely TIA. Evaluated by neurologist. MRI/MRA reviewed consistent with atherosclerotic irregularities. Echocardiogram with mild hypokinesis in the basal inferior septal and inferior walls. No prior echo to compare. Patient has no chest pain, shortness of breath or any cardiac symptoms. LDL is 169. Patient's symptoms completely resolved on arrival to the hospital. She is very eager to go home today.  Started on aspirin for preventative measures. Patient is very hesitant to start on a statin. She reported that she tried statins in the past and did not tolerate because of muscle pain. Education provided to the patient at bedside regarding elevated LDL and importance to control it. She wanted to try with the lowest dose possible this time and follow up with PCP. Dr. Leonie Man from neurology was also at bedside. She preferred the lowest dose of crestor for now. I recommended patient to monitor labs and close to follow-up with PCP and neurologist outpatient.  Patient will follow-up with general neurology for possible outpatient EEG and further evaluation. She is able to ambulate in the hallway without difficulties.  I reviewed the test results including echo, MRI and lab test with the patient and her daughter at bedside in detail. Patient is medically stable on discharge.   Discharge Diagnoses:  Principal Problem:   TIA  (transient ischemic attack) Active Problems:   Hyponatremia   Hypertension    Discharge Instructions  Discharge Instructions    Ambulatory referral to Neurology    Complete by:  As directed    An appointment is requested in approximately: 4 weeks   Call MD for:  difficulty breathing, headache or visual disturbances    Complete by:  As directed    Call MD for:  extreme fatigue    Complete by:  As directed    Call MD for:  hives    Complete by:  As directed    Call MD for:  persistant dizziness or light-headedness    Complete by:  As directed    Call MD for:  persistant nausea and vomiting    Complete by:  As directed    Call MD for:  severe uncontrolled pain    Complete by:  As directed    Call MD for:  temperature >100.4    Complete by:  As directed    Diet - low sodium heart healthy    Complete by:  As directed    Increase activity slowly    Complete by:  As directed      Allergies as of 05/16/2016      Reactions   Azithromycin Diarrhea   Erythromycin Diarrhea   Macrodantin [nitrofurantoin Macrocrystal] Diarrhea   Sulfa Antibiotics Diarrhea      Medication List    TAKE these medications   acetaminophen 325 MG tablet Commonly known as:  TYLENOL Take 650 mg by mouth every 6 (six) hours as needed for mild pain.   aspirin EC 81 MG tablet Take 1 tablet (81 mg total) by mouth daily.   Biotin 5000 MCG Caps Take 5,000 mcg by mouth  daily.   CALCIUM-VITAMIN D PO Take 1 tablet by mouth daily.   docusate sodium 100 MG capsule Commonly known as:  COLACE Take 100 mg by mouth at bedtime.   loratadine 10 MG tablet Commonly known as:  CLARITIN Take 10 mg by mouth daily.   naproxen sodium 220 MG tablet Commonly known as:  ANAPROX Take 220 mg by mouth 2 (two) times daily as needed (pain).   rosuvastatin 5 MG tablet Commonly known as:  CRESTOR Take 1 tablet (5 mg total) by mouth daily at 6 PM.   vitamin C 1000 MG tablet Take 1,000 mg by mouth daily.   vitamin E  400 UNIT capsule Take 400 Units by mouth daily.      Follow-up Information    SETHI,PRAMOD, MD. Schedule an appointment as soon as possible for a visit in 4 week(s).   Specialties:  Neurology, Radiology Contact information: [address] [place] [address] [phone]          Allergies  Allergen Reactions  . Azithromycin Diarrhea  . Erythromycin Diarrhea  . Macrodantin [Nitrofurantoin Macrocrystal] Diarrhea  . Sulfa Antibiotics Diarrhea    Consultations: Neurology  Procedures/Studies: MRI, echo  Subjective: Patient was seen and examined at bedside. She is very eager to go home as soon as possible. Reported she feels completely better. Agreed to take aspirin and low-dose of Crestor. Denied headache, dizziness, nausea, or negative, chest pain, shortness of breath. No muscle pain. Daughter at bedside.  Discharge Exam: Vitals:   05/16/16 0400 05/16/16 0600  BP: (!) 149/57 (!) 143/54  Pulse: 62 64  Resp: 18 18  Temp: 98 F (36.7 C) 97.9 F (36.6 C)   Vitals:   05/15/16 2350 05/16/16 0200 05/16/16 0400 05/16/16 0600  BP: (!) 154/73 (!) 182/74 (!) 149/57 (!) 143/54  Pulse: 70 63 62 64  Resp: 20 20 18 18   Temp: 97 F (36.1 C) 98.3 F (36.8 C) 98 F (36.7 C) 97.9 F (36.6 C)  TempSrc: Oral Oral Oral Oral  SpO2: 100% 100% 100% 97%  Weight:      Height:        General: Pt is alert, awake, not in acute distress Cardiovascular: RRR, S1/S2 +, no rubs, no gallops Respiratory: CTA bilaterally, no wheezing, no rhonchi Abdominal: Soft, NT, ND, bowel sounds + Extremities: no edema, no cyanosis    The results of significant diagnostics from this hospitalization (including imaging, microbiology, ancillary and laboratory) are listed below for reference.     Microbiology: No results found for this or any previous visit (from the past 240 hour(s)).   Labs: BNP (last 3 results) No results for input(s): BNP in the last 8760 hours. Basic Metabolic  Panel:  Recent Labs Lab 05/15/16 1307 05/15/16 1313 05/16/16 0356  NA 131* 130* 136  K 4.0 3.9 4.6  CL 96* 96* 102  CO2 23  --  27  GLUCOSE 97 97 88  BUN 11 13 7   CREATININE 0.89 0.90 0.74  CALCIUM 9.4  --  8.9   Liver Function Tests:  Recent Labs Lab 05/15/16 1307  AST 23  ALT 12*  ALKPHOS 87  BILITOT 0.4  PROT 7.1  ALBUMIN 4.3   No results for input(s): LIPASE, AMYLASE in the last 168 hours. No results for input(s): AMMONIA in the last 168 hours. CBC:  Recent Labs Lab 05/15/16 1307 05/15/16 1313  WBC 7.9  --   NEUTROABS 4.7  --   HGB 13.2 13.9  HCT  38.5 41.0  MCV 93.7  --   PLT 310  --    Cardiac Enzymes: No results for input(s): CKTOTAL, CKMB, CKMBINDEX, TROPONINI in the last 168 hours. BNP: Invalid input(s): POCBNP CBG:  Recent Labs Lab 05/15/16 1327  GLUCAP 96   D-Dimer No results for input(s): DDIMER in the last 72 hours. Hgb A1c No results for input(s): HGBA1C in the last 72 hours. Lipid Profile  Recent Labs  05/15/16 1307  CHOL 269*  HDL 76  LDLCALC 169*  TRIG 120  CHOLHDL 3.5   Thyroid function studies No results for input(s): TSH, T4TOTAL, T3FREE, THYROIDAB in the last 72 hours.  Invalid input(s): FREET3 Anemia work up No results for input(s): VITAMINB12, FOLATE, FERRITIN, TIBC, IRON, RETICCTPCT in the last 72 hours. Urinalysis No results found for: COLORURINE, APPEARANCEUR, LABSPEC, [place], GLUCOSEU, HGBUR, BILIRUBINUR, KETONESUR, PROTEINUR, UROBILINOGEN, NITRITE, LEUKOCYTESUR Sepsis Labs Invalid input(s): PROCALCITONIN,  WBC,  LACTICIDVEN Microbiology No results found for this or any previous visit (from the past 240 hour(s)).   Time coordinating discharge: 36 minutes  SIGNED:   Rosita Fire, MD  [facility] 05/16/2016, 12:38 PM  If 7PM-7AM, please contact night-coverage [email or web address] Password [email or web address]

## 2016-05-16 NOTE — Progress Notes (Signed)
SLP Cancellation Note  Patient Details Name: Erica House MRN: [id number] DOB: [birth year]   Cancelled treatment:       Reason Eval/Treat Not Completed: SLP screened, no needs identified, will sign off. Pt reports at baseline level of function, imaging shows no acute findings. Speech fluent, clear. No needs identified.  Deneise Lever, [place] CF-SLP Speech-Language Pathologist [phone]   [name] 05/16/2016, 10:30 AM

## 2016-05-16 NOTE — Progress Notes (Signed)
OT Cancellation Note  Patient Details Name: Erica House MRN: [id number] DOB: 01-13-1936   Cancelled Treatment:    Reason Eval/Treat Not Completed: OT screened, no needs identified, will sign off. Pt completing ADL independently in hospital setting. All deficits have resolved and no OT needs identified. OT will sign off. Thank you for this referral!  Norman Herrlich, MS OTR/L  Pager: [place] Erica House 05/16/2016, 9:32 AM

## 2016-05-24 DIAGNOSIS — M25551 Pain in right hip: Secondary | ICD-10-CM | POA: Diagnosis not present

## 2016-05-24 DIAGNOSIS — E785 Hyperlipidemia, unspecified: Secondary | ICD-10-CM | POA: Diagnosis not present

## 2016-05-24 DIAGNOSIS — Z8673 Personal history of transient ischemic attack (TIA), and cerebral infarction without residual deficits: Secondary | ICD-10-CM | POA: Diagnosis not present

## 2016-06-10 ENCOUNTER — Other Ambulatory Visit: Payer: Self-pay

## 2016-07-07 ENCOUNTER — Ambulatory Visit: Payer: Medicare Other | Attending: Family Medicine

## 2016-07-07 DIAGNOSIS — M25551 Pain in right hip: Secondary | ICD-10-CM | POA: Insufficient documentation

## 2016-07-07 DIAGNOSIS — G8929 Other chronic pain: Secondary | ICD-10-CM | POA: Diagnosis not present

## 2016-07-07 DIAGNOSIS — M5441 Lumbago with sciatica, right side: Secondary | ICD-10-CM | POA: Diagnosis not present

## 2016-07-07 DIAGNOSIS — R252 Cramp and spasm: Secondary | ICD-10-CM | POA: Insufficient documentation

## 2016-07-07 NOTE — Patient Instructions (Signed)
Abduction: Clam (Eccentric) - Side-Lying    Lie on side with knees bent. Lift top knee, keeping feet together. Keep trunk steady. Slowly lower for 3-5 seconds. 2___ reps per set, ___ sets per day, ___ days per week. Add ___ lbs when you achieve ___ repetitions.  http://ecce.exer.us/65   Copyright  VHI. All rights reserved.  Piriformis Stretch, Sitting- Keep your back straight    Sit, one ankle on opposite knee, same-side hand on crossed knee. Push down on knee, keeping spine straight. Lean torso forward, with flat back, until tension is felt in hamstrings and gluteals of crossed-leg side. Hold _20__ seconds.  Repeat _3__ times per session. Do _3__ sessions per day.  Copyright  VHI. All rights reserved.  HIP: Hamstrings - Short Sitting    Rest leg on raised surface. Keep knee straight. Lift chest. Hold _20__ seconds. _3__ reps per set, _3__ sets per day  Copyright  VHI. All rights reserved.                                             DO's and DON'T's   Avoid and/or Minimize positions of forward bending ( flexion)  Side bending and rotation of the trunk  Especially when movements occur together   When your back aches:   Don't sit down   Lie down on your back with a small pillow under your head and one under your knees or as outlined by our therapist. Or, lie in the 90/90 position ( on the floor with your feet and legs on the sofa with knees and hips bent to 90 degrees)  Tying or putting on your shoes:   Don't bend over to tie your shoes or put on socks.  Instead, bring one foot up, cross it over the opposite knee and bend forward (hinge) at the hips to so the task.  Keep your back straight.  If you cannot do this safely, then you need to use long handled assistive devices such as a shoehorn and sock puller.  Exercising:  Don't engage in ballistic types of exercise routines such as high-impact aerobics or jumping rope  Don't do exercises in the gym that bring you  forward (abdominal crunches, sit-ups, touching your  toes, knee-to-chest, straight leg raising.)  Follow a regular exercise program that includes a variety of different weight-bearing activities, such as low-impact aerobics, T' ai chi or walking as your physical therapist advises  Do exercises that emphasize return to normal body alignment and strengthening of the muscles that keep your back straight, as outlined in this program or by your therapist  Household tasks:  Don't reach unnecessarily or twist your trunk when mopping, sweeping, vacuuming, raking, making beds, weeding gardens, getting objects ou of cupboards, etc.  Keep your broom, mop, vacuum, or rake close to you and mover your whole body as you move them. Walk over to the area on which you are working. Arrange kitchen, bathroom, and bedroom shelves so that frequently used items may be reached without excessive bending, twisting, and reaching.  Use a sturdy stool if necessary.  Don't bend from the waist to pick up something up  Off the floor, out of the trunk of your car, or to brush your teeth, wash your face, etc.   Bend at the knees, keeping back straight as possible. Use a reacher if necessary.   Prevention of fracture  is the so-called "BOTTOm -Line" in the management of OSTEOPOROSIS. Do not take unnecessary chances in movement. Once a compression fracture occurs, the process is very difficult to control; one fracture is frequently followed by many more.   [place] [address], [place], [place] [address] Phone # [phone] Fax [phone]

## 2016-07-07 NOTE — Therapy (Signed)
[facility] [address] [place], [place], [address] Phone: [phone]   Fax:  [phone]  Physical Therapy Evaluation  Patient Details  Name: Erica House MRN: [id number] Date of Birth: 1935/01/23 Referring Provider: Donald Prose, MD  Encounter Date: 07/07/2016      PT End of Session - 07/07/16 1310    Visit Number 1   Number of Visits 10   Date for PT Re-Evaluation 09/01/16   PT Start Time 1230   PT Stop Time 1310   PT Time Calculation (min) 40 min   Activity Tolerance Patient tolerated treatment well   Behavior During Therapy [facility] for tasks assessed/performed      Past Medical History:  Diagnosis Date  . Chronic bilateral low back pain   . Sciatica     History reviewed. No pertinent surgical history.  There were no vitals filed for this visit.       Subjective Assessment - 07/07/16 1231    Subjective Pt presents to PT with complaints of Rt hip pain lasting 4 months.  Pt reports that she was exercising at the [facility] and started to notice a "twinge" in her Rt SI joint 02/2016.  Pain has progressed over a period of time.  Pt has seen a chirpractor and orthopedic MD.  Orthopedic MD gave her injection into Rt SI joint.     Pertinent History osteoporosis   Limitations Walking   How long can you walk comfortably? depends if it "grabs", not able to determine a length of time   Diagnostic tests X-ray at chiropractor and orthorpedic MD: "something" at L4/5 per pt report   Patient Stated Goals reduce pain, back to gym for regular exercise   Currently in Pain? Yes   Pain Score 6    Pain Location Sacrum   Pain Orientation Right   Pain Descriptors / Indicators Sore;Burning   Pain Type Chronic pain   Pain Radiating Towards down Rt leg   Pain Onset More than a month ago   Pain Frequency Intermittent   Aggravating Factors  as the day progresses, walking, gym exercises   Pain Relieving Factors stretching,  ice, heat, change of position   Effect of Pain on Daily Activities not able to do regular exercises            [facility] PT Assessment - 07/07/16 0001      Assessment   Medical Diagnosis Rt hip pain/leg pain   Referring Provider Donald Prose, MD   Onset Date/Surgical Date 03/09/16   Prior Therapy none     Precautions   Precautions Other (comment)   Precaution Comments osteoporosis-?     Restrictions   Weight Bearing Restrictions No     Balance Screen   Has the patient fallen in the past 6 months No   Has the patient had a decrease in activity level because of a fear of falling?  No   Is the patient reluctant to leave their home because of a fear of falling?  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   Overall Cognitive Status Within Functional Limits for tasks assessed     Observation/Other Assessments   Focus on Therapeutic  Outcomes (FOTO)  46% limitation     Posture/Postural Control   Posture/Postural Control Postural limitations   Postural Limitations Forward head     ROM / Strength   AROM / PROM / Strength AROM;PROM;Strength     AROM   Overall AROM  Within functional limits for tasks performed   Overall AROM Comments full lumbar and hip A/ROM with Rt LE pain with Rt lumbar sidebending     PROM   Overall PROM  Within functional limits for tasks performed     Strength   Overall Strength Within functional limits for tasks performed   Overall Strength Comments 4+/5 bil hip and knee strength     Palpation   Spinal mobility reduced PA mobility L4-5   Palpation comment pt with trigger points over Rt gluteals and lumbar paraspinals     Special Tests    Special Tests Lumbar   Lumbar Tests Slump Test;Straight Leg Raise      Slump test   Findings Negative   Side Right     Straight Leg Raise   Findings Negative   Side  Right     Transfers   Transfers Independent with all Transfers     Ambulation/Gait   Ambulation/Gait Yes   Ambulation/Gait Assistance 7: Independent            Objective measurements completed on examination: See above findings.                  PT Education - 07/07/16 1302    Education provided Yes   Education Details osteo dos/donts, hip flexibility, clams   Person(s) Educated Patient   Methods Explanation;Demonstration   Comprehension Verbalized understanding;Returned demonstration          PT Short Term Goals - 07/07/16 1317      PT SHORT TERM GOAL #1   Title be independent in initial HEP   Time 4   Period Weeks   Status New     PT SHORT TERM GOAL #2   Title report 30% reduction in frequency and intensity of Rt LE pain with standing and walking   Time 4   Period Weeks   Status New     PT SHORT TERM GOAL #3   Title verbalize and demonstrate correct body mechanics modifications for osteoporosis precautions and lumbar protection   Time 4   Period Weeks   Status New           PT Long Term Goals - 07/07/16 1223      PT LONG TERM GOAL #1   Title be indpendent in advanced HEP   Time 8   Period Weeks   Status New     PT LONG TERM GOAL #2   Title reduce FOTO to < or = to 37% limitation   Time 8   Period Weeks   Status New     PT LONG TERM GOAL #3   Title return to regular gym exercises with correct body mechanics and without increased pain   Time 8   Period Weeks   Status New     PT LONG TERM GOAL #4   Title report a 70% reduction in the frequency and intensity of Rt LE pain with standing and walking   Time 8   Period Weeks   Status New                Plan - 07/07/16 1310    Clinical Impression Statement Pt presents to PT with  complaints of a 4 month history of Rt SI joint and LE pain.  Pt reports that pain has  progressed over the past months after feeling pain initially after working out at the gym.  Pt had injection into Rt buttock without relief.  Pt demonstrates increased flexibility in bil. hips.  Pt demonstrates normal A/ROM and negative slump and SLR on the Rt.  Pt with active trigger points over Rt gluteals and lumbar spine.  Pt reports 5-6/10 LBP and Rt hip pain with standing and exercise at the gym.  Pt will benefit from skilled PT for core strength, hip strength and hip flexibility, manual and dry needing.     History and Personal Factors relevant to plan of care: osteoporosis   Clinical Presentation Stable   Clinical Decision Making Low   Rehab Potential Good   PT Frequency 2x / week   PT Duration 8 weeks   PT Treatment/Interventions ADLs/Self Care Home Management;Cryotherapy;Electrical Stimulation;Functional mobility training;Ultrasound;Moist Heat;Therapeutic activities;Therapeutic exercise;Neuromuscular re-education;Patient/family education;Passive range of motion;Manual techniques;Dry needling;Taping   PT Next Visit Plan dry needling to Rt>Lt gluteals and lumbar multifidi, core strength, hip strength, manual   Consulted and Agree with Plan of Care Patient      Patient will benefit from skilled therapeutic intervention in order to improve the following deficits and impairments:  Postural dysfunction, Improper body mechanics, Pain, Increased muscle spasms, Decreased endurance  Visit Diagnosis: Pain in right hip - Plan: PT plan of care cert/re-cert  Cramp and spasm - Plan: PT plan of care cert/re-cert  Chronic right-sided low back pain with right-sided sciatica - Plan: PT plan of care cert/re-cert      G-Codes - 60/63/01 1223    Functional Assessment Tool Used (Outpatient Only) 46% limitation   Functional Limitation Mobility: Walking and moving around   Mobility: Walking and Moving Around Current Status (S0109) At least 40 percent but less than 60 percent impaired, limited or  restricted   Mobility: Walking and Moving Around Goal Status [phone]) At least 20 percent but less than 40 percent impaired, limited or restricted       Problem List Patient Active Problem List   Diagnosis Date Noted  . TIA (transient ischemic attack) 05/15/2016  . Hyponatremia 05/15/2016  . Hypertension 05/15/2016  . Sciatica      Sigurd Sos, PT 07/07/16 1:25 PM  [facility] [address], [place], [place], [address] Phone: [phone]   Fax:  [phone]  Name: Annaleigh Steinmeyer MRN: [id number] Date of Birth: [birth year]

## 2016-07-14 ENCOUNTER — Ambulatory Visit: Payer: Medicare Other

## 2016-07-14 ENCOUNTER — Encounter: Payer: Medicare Other | Admitting: Physical Therapy

## 2016-07-14 ENCOUNTER — Ambulatory Visit: Payer: Medicare Other | Admitting: Physical Therapy

## 2016-07-14 DIAGNOSIS — G8929 Other chronic pain: Secondary | ICD-10-CM

## 2016-07-14 DIAGNOSIS — M5441 Lumbago with sciatica, right side: Secondary | ICD-10-CM

## 2016-07-14 DIAGNOSIS — M25551 Pain in right hip: Secondary | ICD-10-CM

## 2016-07-14 DIAGNOSIS — R252 Cramp and spasm: Secondary | ICD-10-CM | POA: Diagnosis not present

## 2016-07-14 NOTE — Patient Instructions (Addendum)
Lifting Principles  .Maintain proper posture and head alignment. .Slide object as close as possible before lifting. .Move obstacles out of the way. .Test before lifting; ask for help if too heavy. .Tighten stomach muscles without holding breath. .Use smooth movements; do not jerk. .Use legs to do the work, and pivot with feet. .Distribute the work load symmetrically and close to the center of trunk. .Push instead of pull whenever possible.   Squat down and hold basket close to stand. Use leg muscles to do the work.    Avoid twisting or bending back. Pivot around using foot movements, and bend at knees if needed when reaching for articles.        Getting Into / Out of Bed   Lower self to lie down on one side by raising legs and lowering head at the same time. Use arms to assist moving without twisting. Bend both knees to roll onto back if desired. To sit up, start from lying on side, and use same move-ments in reverse. Keep trunk aligned with legs.    Shift weight from front foot to back foot as item is lifted off shelf.    When leaning forward to pick object up from floor, extend one leg out behind. Keep back straight. Hold onto a sturdy support with other hand.      Sit upright, head facing forward. Try using a roll to support lower back. Keep shoulders relaxed, and avoid rounded back. Keep hips level with knees. Avoid crossing legs for long periods.    Trigger Point Dry Needling  . What is Trigger Point Dry Needling (DN)? o DN is a physical therapy technique used to treat muscle pain and dysfunction. Specifically, DN helps deactivate muscle trigger points (muscle knots).  o A thin filiform needle is used to penetrate the skin and stimulate the underlying trigger point. The goal is for a local twitch response (LTR) to occur and for the trigger point to relax. No medication of any kind is injected during the procedure.   . What Does Trigger Point Dry Needling  Feel Like?  o The procedure feels different for each individual patient. Some patients report that they do not actually feel the needle enter the skin and overall the process is not painful. Very mild bleeding may occur. However, many patients feel a deep cramping in the muscle in which the needle was inserted. This is the local twitch response.   Marland Kitchen How Will I feel after the treatment? o Soreness is normal, and the onset of soreness may not occur for a few hours. Typically this soreness does not last longer than two days.  o Bruising is uncommon, however; ice can be used to decrease any possible bruising.  o In rare cases feeling tired or nauseous after the treatment is normal. In addition, your symptoms may get worse before they get better, this period will typically not last longer than 24 hours.   . What Can I do After My Treatment? o Increase your hydration by drinking more water for the next 24 hours. o You may place ice or heat on the areas treated that have become sore, however, do not use heat on inflamed or bruised areas. Heat often brings more relief post needling. o You can continue your regular activities, but vigorous activity is not recommended initially after the treatment for 24 hours. o DN is best combined with other physical therapy such as strengthening, stretching, and other therapies.      Hip  Flexor Stretch    Lying on back near edge of bed, bend one leg, foot flat. Hang other leg over edge, relaxed, thigh resting entirely on bed for 20 seconds. Repeat __3__ times. Do _3___ sessions per day. Advanced Exercise: Bend knee back keeping thigh in contact with bed.   Knee to Chest (Flexion)    Pull knee toward chest. Feel stretch in lower back or buttock area. Breathing deeply, Hold __20__ seconds. Repeat with other knee. Repeat _3___ times. Do __3__ sessions per day.  http://gt2.exer.us/226   Copyright  VHI. All rights reserved.     [facility] [address] [place], [place] [address] Phone # [phone] Fax [phone]

## 2016-07-14 NOTE — Therapy (Signed)
[facility] [address] [place], [place], [address] Phone: [phone]   Fax:  [phone]  Physical Therapy Treatment  Patient Details  Name: Erica House MRN: [id number] Date of Birth: [birth year] Referring Provider: Donald Prose, MD  Encounter Date: 07/14/2016      PT End of Session - 07/14/16 1148    Visit Number 2   Number of Visits 10   Date for PT Re-Evaluation 09/01/16   PT Start Time 1100   PT Stop Time 1156   PT Time Calculation (min) 56 min   Activity Tolerance Patient tolerated treatment well   Behavior During Therapy [facility] for tasks assessed/performed      Past Medical History:  Diagnosis Date  . Chronic bilateral low back pain   . Sciatica     History reviewed. No pertinent surgical history.  There were no vitals filed for this visit.      Subjective Assessment - 07/14/16 1056    Subjective I am ready for this to help me.  I need help modifying my exercises to not flex my spine.    Pertinent History osteoporosis   Patient Stated Goals reduce pain, back to gym for regular exercise   Currently in Pain? Yes   Pain Score 6    Pain Location Buttocks   Pain Orientation Right   Pain Descriptors / Indicators Sore;Burning   Pain Type Chronic pain   Pain Radiating Towards down Rt leg   Pain Onset More than a month ago   Pain Frequency Intermittent   Aggravating Factors  as the day progresses, walking, gym exercises   Pain Relieving Factors stretching, ice, heat, change of position                         [facility] Adult PT Treatment/Exercise - 07/14/16 0001      Exercises   Exercises Knee/Hip;Lumbar     Lumbar Exercises: Stretches   Single Knee to Chest Stretch 3 reps;20 seconds   Hip Flexor Stretch 3 reps;20 seconds   Piriformis Stretch 3 reps;20 seconds     Modalities   Modalities Moist Heat     Moist Heat Therapy   Number Minutes Moist Heat 12 Minutes   Moist Heat  Location Lumbar Spine     Manual Therapy   Manual Therapy Soft tissue mobilization;Myofascial release   Manual therapy comments soft tissue elongation and trigger point release to bil lumbar paraspinals and Rt gluteals          Trigger Point Dry Needling - 07/14/16 1115    Consent Given? Yes   Education Handout Provided Yes   Muscles Treated Lower Body Gluteus minimus;Gluteus maximus;Piriformis  lumbar multifidi   Gluteus Maximus Response Twitch response elicited;Palpable increased muscle length   Gluteus Minimus Response Twitch response elicited;Palpable increased muscle length   Piriformis Response Twitch response elicited;Palpable increased muscle length              PT Education - 07/14/16 1055    Education provided Yes   Education Details body mechanics, DN info, hip flexor stretch, gluteal stretch   Person(s) Educated Patient   Methods Explanation;Demonstration;Handout   Comprehension Verbalized understanding;Returned demonstration          PT Short Term Goals - 07/14/16 1054      PT SHORT TERM GOAL #1   Title be independent in initial HEP   Time 4   Status On-going     PT  SHORT TERM GOAL #2   Title report 30% reduction in frequency and intensity of Rt LE pain with standing and walking   Time 4   Period Weeks   Status On-going     PT SHORT TERM GOAL #3   Title verbalize and demonstrate correct body mechanics modifications for osteoporosis precautions and lumbar protection   Time --   Period --   Status Achieved           PT Long Term Goals - 07/07/16 1223      PT LONG TERM GOAL #1   Title be indpendent in advanced HEP   Time 8   Period Weeks   Status New     PT LONG TERM GOAL #2   Title reduce FOTO to < or = to 37% limitation   Time 8   Period Weeks   Status New     PT LONG TERM GOAL #3   Title return to regular gym exercises with correct body mechanics and without increased pain   Time 8   Period Weeks   Status New     PT LONG  TERM GOAL #4   Title report a 70% reduction in the frequency and intensity of Rt LE pain with standing and walking   Time 8   Period Weeks   Status New               Plan - 07/14/16 1101    Clinical Impression Statement Pt with only one session after evaluation.  Pt is frustrated as she isn't getting a good back stretch with new modifications of stretching exercises to not put spine at risk for fracture.  PT modified a knee to chest stretch to allow for gluteal stretch safely.  Pt with trigger points in Rt gluteals and lumbar paraspinals and demonstrated improved tissue mobility after dry needling today.  Pt will continue to benefit from skilled PT for postural strength, hip flexibility, manual and activity modifications to reduce pain and risk of fracture.     Rehab Potential Good   PT Frequency 2x / week   PT Duration 8 weeks   PT Treatment/Interventions ADLs/Self Care Home Management;Cryotherapy;Electrical Stimulation;Functional mobility training;Ultrasound;Moist Heat;Therapeutic activities;Therapeutic exercise;Neuromuscular re-education;Patient/family education;Passive range of motion;Manual techniques;Dry needling;Taping   PT Next Visit Plan assess response to dry needling, core strength, postural strength, hip strength, manual   Consulted and Agree with Plan of Care Patient      Patient will benefit from skilled therapeutic intervention in order to improve the following deficits and impairments:  Postural dysfunction, Improper body mechanics, Pain, Increased muscle spasms, Decreased endurance  Visit Diagnosis: Pain in right hip  Cramp and spasm  Chronic right-sided low back pain with right-sided sciatica     Problem List Patient Active Problem List   Diagnosis Date Noted  . TIA (transient ischemic attack) 05/15/2016  . Hyponatremia 05/15/2016  . Hypertension 05/15/2016  . Sciatica     Sigurd Sos, PT 07/14/16 11:50 AM  [facility] [address], [place], [place], [address] Phone: [phone]   Fax:  [phone]  Name: Kristiann Noyce MRN: [id number] Date of Birth: 25-Sep-1935

## 2016-07-20 ENCOUNTER — Ambulatory Visit: Payer: Medicare Other | Attending: Family Medicine | Admitting: Rehabilitative and Restorative Service Providers"

## 2016-07-20 DIAGNOSIS — G8929 Other chronic pain: Secondary | ICD-10-CM

## 2016-07-20 DIAGNOSIS — R252 Cramp and spasm: Secondary | ICD-10-CM | POA: Insufficient documentation

## 2016-07-20 DIAGNOSIS — M5441 Lumbago with sciatica, right side: Secondary | ICD-10-CM | POA: Diagnosis not present

## 2016-07-20 DIAGNOSIS — M25551 Pain in right hip: Secondary | ICD-10-CM

## 2016-07-20 NOTE — Therapy (Signed)
[facility] [address] [place], [place], [address] Phone: [phone]   Fax:  [phone]  Physical Therapy Treatment  Patient Details  Name: Erica House MRN: [id number] Date of Birth: 07-23-1935 Referring Provider: Donald Prose, MD  Encounter Date: 07/20/2016      PT End of Session - 07/20/16 1330    Visit Number 3   Number of Visits 10   PT Start Time 1232   PT Stop Time 3762   PT Time Calculation (min) 46 min   Activity Tolerance Patient tolerated treatment well;No increased pain   Behavior During Therapy [facility] for tasks assessed/performed      Past Medical History:  Diagnosis Date  . Chronic bilateral low back pain   . Sciatica     No past surgical history on file.  There were no vitals filed for this visit.      Subjective Assessment - 07/20/16 1326    Subjective I am doing well but need to get back to doing my normal. The pain still bothers me.   Pertinent History osteoporosis   Limitations Walking   How long can you walk comfortably? depends if it "grabs", not able to determine a length of time   Diagnostic tests X-ray at chiropractor and ortho MD: "something" at L4/5 per pt report   Patient Stated Goals reduce pain, back to gym for regular exercise   Currently in Pain? Yes   Pain Score 4    Pain Location Buttocks   Pain Orientation Right   Pain Descriptors / Indicators Burning   Pain Type Chronic pain   Pain Radiating Towards down R Leg   Pain Onset More than a month ago   Pain Frequency Intermittent   Aggravating Factors  as day progresses, unsure of aggravating factors specifically   Pain Relieving Factors stretching, ice, heat   Effect of Pain on Daily Activities has to modify regular exercises                         [facility] Adult PT Treatment/Exercise - 07/20/16 0001      Lumbar Exercises: Stretches   Pelvic Tilt --  seated trunk flex w/neutral spine 2x15 sec hold      Lumbar Exercises: Aerobic   Stationary Bike level 3 x 5 min with PT verbal cues for posture and tilt     Lumbar Exercises: Standing   Other Standing Lumbar Exercises wall slides x 18 prior to pt reporting R LE burning; standing R hip ext adduction taps with relievement of sxs;      Lumbar Exercises: Supine   Other Supine Lumbar Exercises knee to chest stretch 2x30 sec, LTR x 5 with 5 sec holds decompression exercises unilat UE/LE  x 10; tilt into table top unilat and bil x 15 each; tabletop with limited ROM  trunk rot x 10; LTR x 10 with rock; heel taps x 20 each alt and bil; supine figure 4 x 30 sec;hamstring stretch R with ankle pump neural glide x 20; figure 4 R with  neural glide x 20     Lumbar Exercises: Sidelying   Other Sidelying Lumbar Exercises hip ext with tilt x 10 bil                  PT Short Term Goals - 07/20/16 1334      PT SHORT TERM GOAL #1   Title be independent in initial HEP   Time  4   Period Weeks   Status On-going     PT SHORT TERM GOAL #2   Title report 30% reduction in frequency and intensity of Rt LE pain with standing and walking   Time 4   Period Weeks   Status On-going     PT SHORT TERM GOAL #3   Title verbalize and demonstrate correct body mechanics modifications for osteoporosis precautions and lumbar protection   Time 4   Period Weeks   Status On-going           PT Long Term Goals - 07/07/16 1223      PT LONG TERM GOAL #1   Title be indpendent in advanced HEP   Time 8   Period Weeks   Status New     PT LONG TERM GOAL #2   Title reduce FOTO to < or = to 37% limitation   Time 8   Period Weeks   Status New     PT LONG TERM GOAL #3   Title return to regular gym exercises with correct body mechanics and without increased pain   Time 8   Period Weeks   Status New     PT LONG TERM GOAL #4   Title report a 70% reduction in the frequency and intensity of Rt LE pain with standing and walking   Time 8   Period Weeks    Status New               Plan - 07/20/16 1332    Clinical Impression Statement Pt presents to PT with R sciatica along ITB/peroneal distribution. Pt is extremely flexibile and needs verbal cues for neutral spine posture due to osteoporosis. Pt without symptoms until wall slides performed; neural glides various positions performed to alleviate sciatic nerve pain with success. Pt would benefit from further lumbar/core strengthening and neural glide therex to increase painfree function.   Rehab Potential Good   PT Frequency 2x / week   PT Duration 8 weeks   PT Treatment/Interventions ADLs/Self Care Home Management;Cryotherapy;Electrical Stimulation;Functional mobility training;Ultrasound;Moist Heat;Therapeutic activities;Therapeutic exercise;Neuromuscular re-education;Patient/family education;Passive range of motion;Manual techniques;Dry needling;Taping   PT Next Visit Plan assess response to dry needling, core strength, postural strength, hip strength, manual   Consulted and Agree with Plan of Care Patient      Patient will benefit from skilled therapeutic intervention in order to improve the following deficits and impairments:  Postural dysfunction, Improper body mechanics, Pain, Increased muscle spasms, Decreased endurance  Visit Diagnosis: Pain in right hip  Cramp and spasm  Chronic right-sided low back pain with right-sided sciatica     Problem List Patient Active Problem List   Diagnosis Date Noted  . TIA (transient ischemic attack) 05/15/2016  . Hyponatremia 05/15/2016  . Hypertension 05/15/2016  . Sciatica     ARTIS,Gwendalyn Mcgonagle, PT 07/20/2016, 1:35 PM  [facility] [address], [place], [place], [address] Phone: [phone]   Fax:  [phone]  Name: Erica House MRN: [id number] Date of Birth: [birth year]

## 2016-07-23 DIAGNOSIS — Z961 Presence of intraocular lens: Secondary | ICD-10-CM | POA: Diagnosis not present

## 2016-07-23 DIAGNOSIS — H5203 Hypermetropia, bilateral: Secondary | ICD-10-CM | POA: Diagnosis not present

## 2016-07-26 ENCOUNTER — Ambulatory Visit: Payer: Medicare Other

## 2016-07-26 DIAGNOSIS — M25551 Pain in right hip: Secondary | ICD-10-CM

## 2016-07-26 DIAGNOSIS — M5441 Lumbago with sciatica, right side: Secondary | ICD-10-CM

## 2016-07-26 DIAGNOSIS — R252 Cramp and spasm: Secondary | ICD-10-CM | POA: Diagnosis not present

## 2016-07-26 DIAGNOSIS — G8929 Other chronic pain: Secondary | ICD-10-CM

## 2016-07-26 NOTE — Patient Instructions (Addendum)
Bridge    Lie back, legs bent. Inhale, pressing hips up. Keeping ribs in, lengthen lower back. Exhale, rolling down along spine from top.  Hold 5 seconds.  Repeat _2x10___ times. Do __1__ sessions per day.   Strengthening: Hip Extension (Prone)    Tighten muscles on front of left thigh, then lift leg, keeping knee locked. Repeat _10___ times per set. Do _2__ sets per session. Do __1__ sessions per day.   [facility] [address], [place], [place] [address] Phone # [phone] Fax [phone]

## 2016-07-26 NOTE — Therapy (Signed)
[facility] [address] [place], [place], [address] Phone: [phone]   Fax:  [phone]  Physical Therapy Treatment  Patient Details  Name: Erica House MRN: [id number] Date of Birth: 1935-09-20 Referring Provider: Donald Prose, MD  Encounter Date: 07/26/2016      PT End of Session - 07/26/16 1141    Visit Number 4   Number of Visits 10   Date for PT Re-Evaluation 09/01/16   PT Start Time 1100  dry needling   PT Stop Time 1154   PT Time Calculation (min) 54 min   Activity Tolerance Patient tolerated treatment well   Behavior During Therapy [facility] for tasks assessed/performed      Past Medical History:  Diagnosis Date  . Chronic bilateral low back pain   . Sciatica     History reviewed. No pertinent surgical history.  There were no vitals filed for this visit.      Subjective Assessment - 07/26/16 1100    Subjective I am feeling better.  I bought a ball to work on the trigger point in my buttock.     Pertinent History osteoporosis   How long can you walk comfortably? depends if it "grabs", not able to determine a length of time   Patient Stated Goals reduce pain, back to gym for regular exercise   Currently in Pain? Yes   Pain Score 5    Pain Location Buttocks   Pain Orientation Right   Pain Descriptors / Indicators Burning   Pain Type Chronic pain   Pain Onset More than a month ago   Pain Frequency Intermittent   Aggravating Factors  walking/standing, activity   Pain Relieving Factors stretching, ice, heat                         [facility] Adult PT Treatment/Exercise - 07/26/16 0001      Lumbar Exercises: Stretches   Single Knee to Chest Stretch 3 reps;20 seconds     Knee/Hip Exercises: Supine   Bridges Limitations 2x10 with 5 second hold   Other Supine Knee/Hip Exercises trigger point release of Rt gluteals with use of ball     Knee/Hip Exercises: Prone   Straight Leg Raises  Strengthening;2 sets;10 reps     Modalities   Modalities Moist Heat     Moist Heat Therapy   Number Minutes Moist Heat 12 Minutes   Moist Heat Location Lumbar Spine     Manual Therapy   Manual Therapy Soft tissue mobilization;Myofascial release   Manual therapy comments soft tissue elongation and trigger point release to bil lumbar paraspinals and Rt gluteals          Trigger Point Dry Needling - 07/26/16 1112    Consent Given? Yes   Muscles Treated Lower Body Gluteus minimus;Gluteus maximus;Piriformis   Gluteus Maximus Response Twitch response elicited;Palpable increased muscle length   Gluteus Minimus Response Twitch response elicited;Palpable increased muscle length   Piriformis Response Twitch response elicited;Palpable increased muscle length              PT Education - 07/26/16 1119    Education provided Yes   Education Details bridge and prone hip extension   Person(s) Educated Patient   Methods Explanation;Handout;Demonstration   Comprehension Verbalized understanding;Returned demonstration          PT Short Term Goals - 07/26/16 1104      PT SHORT TERM GOAL #1   Title be independent  in initial HEP   Status Achieved     PT SHORT TERM GOAL #2   Title report 30% reduction in frequency and intensity of Rt LE pain with standing and walking   Time 4   Period Weeks   Status On-going     PT SHORT TERM GOAL #3   Title verbalize and demonstrate correct body mechanics modifications for osteoporosis precautions and lumbar protection   Status Achieved           PT Long Term Goals - 07/07/16 1223      PT LONG TERM GOAL #1   Title be indpendent in advanced HEP   Time 8   Period Weeks   Status New     PT LONG TERM GOAL #2   Title reduce FOTO to < or = to 37% limitation   Time 8   Period Weeks   Status New     PT LONG TERM GOAL #3   Title return to regular gym exercises with correct body mechanics and without increased pain   Time 8   Period  Weeks   Status New     PT LONG TERM GOAL #4   Title report a 70% reduction in the frequency and intensity of Rt LE pain with standing and walking   Time 8   Period Weeks   Status New               Plan - 07/26/16 1113    Clinical Impression Statement Pt denies any significant change in symptoms since the start of care.  Pt has modified her exercise routine and with activity at home to protect the lumbar spine.  Pt with trigger points in Rt gluteals and demonstrated improved mobility after dry needling today.  Pt will continue to benefit from skilled PT for strength, core strength, flexibility and manual/modalities as needed.     Rehab Potential Good   PT Frequency 2x / week   PT Duration 8 weeks   PT Treatment/Interventions ADLs/Self Care Home Management;Cryotherapy;Electrical Stimulation;Functional mobility training;Ultrasound;Moist Heat;Therapeutic activities;Therapeutic exercise;Neuromuscular re-education;Patient/family education;Passive range of motion;Manual techniques;Dry needling;Taping   PT Next Visit Plan assess response to dry needling, core strength, postural strength, hip strength, manual   Consulted and Agree with Plan of Care Patient      Patient will benefit from skilled therapeutic intervention in order to improve the following deficits and impairments:  Postural dysfunction, Improper body mechanics, Pain, Increased muscle spasms, Decreased endurance  Visit Diagnosis: Pain in right hip  Cramp and spasm  Chronic right-sided low back pain with right-sided sciatica     Problem List Patient Active Problem List   Diagnosis Date Noted  . TIA (transient ischemic attack) 05/15/2016  . Hyponatremia 05/15/2016  . Hypertension 05/15/2016  . Sciatica      Sigurd Sos, PT 07/26/16 11:45 AM  [facility] [address] [place], [place], [address] Phone: [phone]   Fax:  [phone]  Name:  Erica House MRN: [id number] Date of Birth: 12/05/1935

## 2016-07-29 ENCOUNTER — Ambulatory Visit: Payer: Medicare Other

## 2016-07-29 DIAGNOSIS — M5441 Lumbago with sciatica, right side: Secondary | ICD-10-CM | POA: Diagnosis not present

## 2016-07-29 DIAGNOSIS — G8929 Other chronic pain: Secondary | ICD-10-CM | POA: Diagnosis not present

## 2016-07-29 DIAGNOSIS — R252 Cramp and spasm: Secondary | ICD-10-CM | POA: Diagnosis not present

## 2016-07-29 DIAGNOSIS — M25551 Pain in right hip: Secondary | ICD-10-CM | POA: Diagnosis not present

## 2016-07-29 NOTE — Therapy (Signed)
[facility] [address] [place], [place], [address] Phone: [phone]   Fax:  [phone]  Physical Therapy Treatment  Patient Details  Name: Erica House MRN: [id number] Date of Birth: 1935-01-21 Referring Provider: Donald Prose, MD  Encounter Date: 07/29/2016      PT End of Session - 07/29/16 1142    Visit Number 5   Number of Visits 10   Date for PT Re-Evaluation 09/01/16   PT Start Time 5638   PT Stop Time 1152   PT Time Calculation (min) 50 min   Activity Tolerance Patient tolerated treatment well   Behavior During Therapy [facility] for tasks assessed/performed      Past Medical History:  Diagnosis Date  . Chronic bilateral low back pain   . Sciatica     History reviewed. No pertinent surgical history.  There were no vitals filed for this visit.      Subjective Assessment - 07/29/16 1106    Subjective I had a bad day yesterday.  I was walking in [place] and I had to leave to go home and rest.     Currently in Pain? Yes   Pain Score 5    Pain Location Buttocks   Pain Orientation Right   Pain Descriptors / Indicators Burning   Pain Type Chronic pain   Pain Onset More than a month ago   Pain Frequency Intermittent   Aggravating Factors  walking/standing, activing   Pain Relieving Factors stretching, ice, heat                         [facility] Adult PT Treatment/Exercise - 07/29/16 0001      Lumbar Exercises: Stretches   Active Hamstring Stretch 3 reps;20 seconds   Single Knee to Chest Stretch 3 reps;20 seconds   Piriformis Stretch 3 reps;20 seconds     Knee/Hip Exercises: Aerobic   Nustep Level 1x 8 mintues      Knee/Hip Exercises: Supine   Bridges Limitations 2x10 with 5 second hold     Modalities   Modalities Moist Heat;Electrical Stimulation     Moist Heat Therapy   Number Minutes Moist Heat 15 Minutes   Moist Heat Location Hip;Lumbar Spine     Electrical Stimulation    Electrical Stimulation Location Rt gluteals   Electrical Stimulation Action IFC   Electrical Stimulation Parameters 15 minutes   Electrical Stimulation Goals Pain                  PT Short Term Goals - 07/26/16 1104      PT SHORT TERM GOAL #1   Title be independent in initial HEP   Status Achieved     PT SHORT TERM GOAL #2   Title report 30% reduction in frequency and intensity of Rt LE pain with standing and walking   Time 4   Period Weeks   Status On-going     PT SHORT TERM GOAL #3   Title verbalize and demonstrate correct body mechanics modifications for osteoporosis precautions and lumbar protection   Status Achieved           PT Long Term Goals - 07/07/16 1223      PT LONG TERM GOAL #1   Title be indpendent in advanced HEP   Time 8   Period Weeks   Status New     PT LONG TERM GOAL #2   Title reduce FOTO to < or = to  37% limitation   Time 8   Period Weeks   Status New     PT LONG TERM GOAL #3   Title return to regular gym exercises with correct body mechanics and without increased pain   Time 8   Period Weeks   Status New     PT LONG TERM GOAL #4   Title report a 70% reduction in the frequency and intensity of Rt LE pain with standing and walking   Time 8   Period Weeks   Status New               Plan - 07/29/16 1109    Clinical Impression Statement Pt had a flare-up of pain after walking in [employer] yesterday.  Pt denies any significant progress in symptoms.  Pt has modified her exercise routine at home to protect her lumbar spine.  Pt will assess symptoms after next week and then determine need to see MD again.     Rehab Potential Good   PT Frequency 2x / week   PT Duration 8 weeks   PT Treatment/Interventions ADLs/Self Care Home Management;Cryotherapy;Electrical Stimulation;Functional mobility training;Ultrasound;Moist Heat;Therapeutic activities;Therapeutic exercise;Neuromuscular re-education;Patient/family education;Passive  range of motion;Manual techniques;Dry needling;Taping   PT Next Visit Plan dry needling next, core strength, postural strength, hip strength, manual.  See if pt likes e-stim and pursue home TENs if pt likes it   Consulted and Agree with Plan of Care Patient      Patient will benefit from skilled therapeutic intervention in order to improve the following deficits and impairments:  Postural dysfunction, Improper body mechanics, Pain, Increased muscle spasms, Decreased endurance  Visit Diagnosis: Pain in right hip  Cramp and spasm  Chronic right-sided low back pain with right-sided sciatica     Problem List Patient Active Problem List   Diagnosis Date Noted  . TIA (transient ischemic attack) 05/15/2016  . Hyponatremia 05/15/2016  . Hypertension 05/15/2016  . Sciatica     Sigurd Sos, PT 07/29/16 11:43 AM  [facility] [address], [place], [place], [address] Phone: [phone]   Fax:  [phone]  Name: Erica House MRN: [id number] Date of Birth: 07/06/1935

## 2016-08-02 ENCOUNTER — Ambulatory Visit: Payer: Medicare Other

## 2016-08-02 DIAGNOSIS — R252 Cramp and spasm: Secondary | ICD-10-CM

## 2016-08-02 DIAGNOSIS — M5441 Lumbago with sciatica, right side: Secondary | ICD-10-CM

## 2016-08-02 DIAGNOSIS — G8929 Other chronic pain: Secondary | ICD-10-CM

## 2016-08-02 DIAGNOSIS — M25551 Pain in right hip: Secondary | ICD-10-CM | POA: Diagnosis not present

## 2016-08-02 NOTE — Therapy (Signed)
[facility] [address] [place], [place], [address] Phone: [phone]   Fax:  [phone]  Physical Therapy Treatment  Patient Details  Name: Erica House MRN: [id number] Date of Birth: 1935-12-06 Referring Provider: Donald Prose, MD  Encounter Date: 08/02/2016      PT End of Session - 08/02/16 1521    Visit Number 6   Number of Visits 10   Date for PT Re-Evaluation 09/01/16   PT Start Time 9417   PT Stop Time 1529  dry needling   PT Time Calculation (min) 46 min   Activity Tolerance Patient tolerated treatment well   Behavior During Therapy [facility] for tasks assessed/performed      Past Medical History:  Diagnosis Date  . Chronic bilateral low back pain   . Sciatica     History reviewed. No pertinent surgical history.  There were no vitals filed for this visit.      Subjective Assessment - 08/02/16 1445    Subjective No change.  I want to dry needling one more time.  E-stim didn't really help last time.   Currently in Pain? Yes   Pain Score 7    Pain Location Buttocks   Pain Orientation Right   Pain Descriptors / Indicators Burning   Pain Type Chronic pain   Pain Onset More than a month ago   Pain Frequency Intermittent   Aggravating Factors  walking/standing, as the day progresses   Pain Relieving Factors stretching, ice, heat- minimal result                         [facility] Adult PT Treatment/Exercise - 08/02/16 0001      Lumbar Exercises: Stretches   Active Hamstring Stretch 3 reps;20 seconds   Single Knee to Chest Stretch 3 reps;20 seconds   Piriformis Stretch 3 reps;20 seconds     Modalities   Modalities Moist Heat     Moist Heat Therapy   Number Minutes Moist Heat 15 Minutes   Moist Heat Location Hip;Lumbar Spine     Manual Therapy   Manual Therapy Soft tissue mobilization;Myofascial release   Manual therapy comments soft tissue elongation and trigger point release  to bil lumbar paraspinals and Rt gluteals          Trigger Point Dry Needling - 08/02/16 1443    Consent Given? Yes   Muscles Treated Lower Body Gluteus minimus;Gluteus maximus;Piriformis  bil lumbar paraspinals   Gluteus Maximus Response Twitch response elicited;Palpable increased muscle length   Gluteus Minimus Response Twitch response elicited;Palpable increased muscle length   Piriformis Response Twitch response elicited;Palpable increased muscle length                PT Short Term Goals - 08/02/16 1446      PT SHORT TERM GOAL #1   Title be independent in initial HEP   Status Achieved     PT SHORT TERM GOAL #2   Title report 30% reduction in frequency and intensity of Rt LE pain with standing and walking   Period Weeks   Status On-going     PT SHORT TERM GOAL #3   Title verbalize and demonstrate correct body mechanics modifications for osteoporosis precautions and lumbar protection   Status Achieved           PT Long Term Goals - 07/07/16 1223      PT LONG TERM GOAL #1   Title be indpendent in advanced  HEP   Time 8   Period Weeks   Status New     PT LONG TERM GOAL #2   Title reduce FOTO to < or = to 37% limitation   Time 8   Period Weeks   Status New     PT LONG TERM GOAL #3   Title return to regular gym exercises with correct body mechanics and without increased pain   Time 8   Period Weeks   Status New     PT LONG TERM GOAL #4   Title report a 70% reduction in the frequency and intensity of Rt LE pain with standing and walking   Time 8   Period Weeks   Status New               Plan - 08/02/16 1511    Clinical Impression Statement Pt without significant change in pain since the start of care.  Pt plans to call MD to discuss MRI due to continued symptoms.  Pt has modified her exercise routine to protect her spine and reduce extreme flexion.  Pt with mild trigger points in Rt gluteals and Rt lumbar multifiidi.  Pt will conitnue to  benefit from skilled PT for Rt hip flexibility and strength.     Rehab Potential Good   PT Frequency 2x / week   PT Duration 8 weeks   PT Treatment/Interventions ADLs/Self Care Home Management;Cryotherapy;Electrical Stimulation;Functional mobility training;Ultrasound;Moist Heat;Therapeutic activities;Therapeutic exercise;Neuromuscular re-education;Patient/family education;Passive range of motion;Manual techniques;Dry needling;Taping   PT Next Visit Plan Assess response to dry needling,  core strength, postural strength, hip strength, manual.     Consulted and Agree with Plan of Care Patient      Patient will benefit from skilled therapeutic intervention in order to improve the following deficits and impairments:  Postural dysfunction, Improper body mechanics, Pain, Increased muscle spasms, Decreased endurance  Visit Diagnosis: Pain in right hip  Cramp and spasm  Chronic right-sided low back pain with right-sided sciatica     Problem List Patient Active Problem List   Diagnosis Date Noted  . TIA (transient ischemic attack) 05/15/2016  . Hyponatremia 05/15/2016  . Hypertension 05/15/2016  . Sciatica      Sigurd Sos, PT 08/02/16 3:24 PM  [facility] [address] [place], [place], [address] Phone: [phone]   Fax:  [phone]  Name: Erica House MRN: [id number] Date of Birth: 07/15/1935

## 2016-08-04 ENCOUNTER — Ambulatory Visit: Payer: Self-pay | Admitting: Neurology

## 2016-08-05 ENCOUNTER — Ambulatory Visit: Payer: Medicare Other

## 2016-08-05 DIAGNOSIS — R252 Cramp and spasm: Secondary | ICD-10-CM

## 2016-08-05 DIAGNOSIS — M25551 Pain in right hip: Secondary | ICD-10-CM | POA: Diagnosis not present

## 2016-08-05 DIAGNOSIS — M5441 Lumbago with sciatica, right side: Secondary | ICD-10-CM | POA: Diagnosis not present

## 2016-08-05 DIAGNOSIS — G8929 Other chronic pain: Secondary | ICD-10-CM | POA: Diagnosis not present

## 2016-08-05 NOTE — Therapy (Signed)
[facility] [address], [place], [place], [address] Phone: [phone]   Fax:  [phone]  Physical Therapy Treatment  Patient Details  Name: Erica House MRN: [id number] Date of Birth: 1935-03-28 Referring Provider: Donald Prose, MD  Encounter Date: 08/05/2016      PT End of Session - 08/05/16 1223    Visit Number 7   Number of Visits 10   Date for PT Re-Evaluation 09/01/16   PT Start Time 3419   PT Stop Time 1224   PT Time Calculation (min) 35 min   Activity Tolerance Patient tolerated treatment well   Behavior During Therapy [facility] for tasks assessed/performed      Past Medical History:  Diagnosis Date  . Chronic bilateral low back pain   . Sciatica     History reviewed. No pertinent surgical history.  There were no vitals filed for this visit.      Subjective Assessment - 08/05/16 1151    Subjective Still with pain in the Rt LE.  I thought I was feeling better after dry needling, but I really dont think so now.     Currently in Pain? Yes   Pain Score 4    Pain Location Buttocks   Pain Orientation Right   Pain Descriptors / Indicators Burning   Pain Type Chronic pain   Pain Radiating Towards down Rt leg                         [facility] Adult PT Treatment/Exercise - 08/05/16 0001      Lumbar Exercises: Stretches   Active Hamstring Stretch 3 reps;20 seconds  with strap   Single Knee to Chest Stretch 3 reps;20 seconds   Piriformis Stretch 3 reps;20 seconds     Lumbar Exercises: Prone   Straight Leg Raise 20 reps  tactile cues for position     Knee/Hip Exercises: Aerobic   Nustep Level 1x 8 mintues      Knee/Hip Exercises: Standing   Hip Abduction Stengthening;Both;2 sets;10 reps   Hip Extension Stengthening;Both;2 sets;10 reps     Knee/Hip Exercises: Supine   Bridges Limitations 2x10 with 5 second hold     Modalities   Modalities --                  PT  Short Term Goals - 08/02/16 1446      PT SHORT TERM GOAL #1   Title be independent in initial HEP   Status Achieved     PT SHORT TERM GOAL #2   Title report 30% reduction in frequency and intensity of Rt LE pain with standing and walking   Period Weeks   Status On-going     PT SHORT TERM GOAL #3   Title verbalize and demonstrate correct body mechanics modifications for osteoporosis precautions and lumbar protection   Status Achieved           PT Long Term Goals - 07/07/16 1223      PT LONG TERM GOAL #1   Title be indpendent in advanced HEP   Time 8   Period Weeks   Status New     PT LONG TERM GOAL #2   Title reduce FOTO to < or = to 37% limitation   Time 8   Period Weeks   Status New     PT LONG TERM GOAL #3   Title return to regular gym exercises with correct body mechanics and  without increased pain   Time 8   Period Weeks   Status New     PT LONG TERM GOAL #4   Title report a 70% reduction in the frequency and intensity of Rt LE pain with standing and walking   Time 8   Period Weeks   Status New               Plan - 08/05/16 1153    Clinical Impression Statement Pt denies any change in Rt LE symptoms.  PT encouraged pt to call MD to schedule a follow-up.  Pt with 4-8/10 Rt LE radiculopathy and this worsens as the day progresses.  Pt is continuing to focus on flexibility, body mechanics education, core and gluteal strength.  Pt will call MD to schedule an appointment.     Rehab Potential Good   PT Frequency 2x / week   PT Duration 8 weeks   PT Treatment/Interventions ADLs/Self Care Home Management;Cryotherapy;Electrical Stimulation;Functional mobility training;Ultrasound;Moist Heat;Therapeutic activities;Therapeutic exercise;Neuromuscular re-education;Patient/family education;Passive range of motion;Manual techniques;Dry needling;Taping   PT Next Visit Plan Dry needling to Rt gluteals (?),  core strength, postural strength, hip strength, manual.      Consulted and Agree with Plan of Care Patient      Patient will benefit from skilled therapeutic intervention in order to improve the following deficits and impairments:  Postural dysfunction, Improper body mechanics, Pain, Increased muscle spasms, Decreased endurance  Visit Diagnosis: Pain in right hip  Cramp and spasm  Chronic right-sided low back pain with right-sided sciatica     Problem List Patient Active Problem List   Diagnosis Date Noted  . TIA (transient ischemic attack) 05/15/2016  . Hyponatremia 05/15/2016  . Hypertension 05/15/2016  . Sciatica      Sigurd Sos, PT 08/05/16 12:27 PM  [facility] [address], [place], [place], [address] Phone: [phone]   Fax:  [phone]  Name: Erica House MRN: [id number] Date of Birth: 02-09-1935

## 2016-08-09 ENCOUNTER — Ambulatory Visit: Payer: Medicare Other

## 2016-08-09 DIAGNOSIS — M5441 Lumbago with sciatica, right side: Secondary | ICD-10-CM | POA: Diagnosis not present

## 2016-08-09 DIAGNOSIS — R252 Cramp and spasm: Secondary | ICD-10-CM

## 2016-08-09 DIAGNOSIS — G8929 Other chronic pain: Secondary | ICD-10-CM | POA: Diagnosis not present

## 2016-08-09 DIAGNOSIS — M25551 Pain in right hip: Secondary | ICD-10-CM

## 2016-08-09 NOTE — Therapy (Addendum)
[facility] [address] [place], [place], [address] Phone: [phone]   Fax:  [phone]  Physical Therapy Treatment  Patient Details  Name: Erica House MRN: [id number] Date of Birth: 02-16-1935 Referring Provider: Donald Prose, MD  Encounter Date: 08/09/2016      PT End of Session - 08/09/16 1223    Visit Number 8   Number of Visits 10   Date for PT Re-Evaluation 09/01/16   PT Start Time 1150   PT Stop Time 1223   PT Time Calculation (min) 33 min   Activity Tolerance Patient tolerated treatment well   Behavior During Therapy Scnetx for tasks assessed/performed      Past Medical History:  Diagnosis Date  . Chronic bilateral low back pain   . Sciatica     History reviewed. No pertinent surgical history.  There were no vitals filed for this visit.      Subjective Assessment - 08/09/16 1151    Subjective I have an MRI scheduled on Wednesday and will see MD 08/18/16.  Will place PT on hold until that time.     Diagnostic tests X-ray at chiropractor and ortho MD: "something" at L4/5 per pt report   Patient Stated Goals reduce pain, back to gym for regular exercise   Currently in Pain? Yes   Pain Score 4    Pain Location Buttocks   Pain Orientation Right   Pain Descriptors / Indicators Burning   Pain Type Chronic pain   Pain Radiating Towards down Rt leg   Pain Onset More than a month ago   Pain Frequency Intermittent   Aggravating Factors  walking/standing, as the day progresses   Pain Relieving Factors stretching, ice, heat- minimal result                         [facility] Adult PT Treatment/Exercise - 08/09/16 0001      Lumbar Exercises: Stretches   Active Hamstring Stretch 3 reps;20 seconds  with strap   Single Knee to Chest Stretch 3 reps;20 seconds   Piriformis Stretch 3 reps;20 seconds     Lumbar Exercises: Prone   Straight Leg Raise 20 reps  tactile cues for position      Knee/Hip Exercises: Aerobic   Nustep Level 1x 8 mintues      Knee/Hip Exercises: Standing   Hip Abduction Stengthening;Both;2 sets;10 reps   Hip Extension Stengthening;Both;2 sets;10 reps     Knee/Hip Exercises: Supine   Bridges Limitations 2x10 with 5 second hold                  PT Short Term Goals - 08/09/16 1153      PT SHORT TERM GOAL #2   Title report 30% reduction in frequency and intensity of Rt LE pain with standing and walking   Time 4   Period Weeks   Status On-going     PT SHORT TERM GOAL #3   Title verbalize and demonstrate correct body mechanics modifications for osteoporosis precautions and lumbar protection   Status Achieved           PT Long Term Goals - 08/09/16 1153      PT LONG TERM GOAL #1   Title be indpendent in advanced HEP   Time 8   Period Weeks   Status On-going     PT LONG TERM GOAL #2   Title reduce FOTO to < or = to 37% limitation  Time 8   Period Weeks   Status On-going     PT LONG TERM GOAL #3   Title return to regular gym exercises with correct body mechanics and without increased pain   Time 8   Period Weeks   Status On-going               Plan - 2016/08/25 1154    Clinical Impression Statement Pt denies any change in Rt LE symptoms since the start of care.  Pt will have MRI in 2 days and follow-up with MD a week after the MRI.  PT will be placed on hold until after MRI.  Pt is continuing to focus on flexibility, body mechanics education, core and gluteal strength.     Rehab Potential Good   PT Frequency 2x / week   PT Duration 8 weeks   PT Treatment/Interventions ADLs/Self Care Home Management;Cryotherapy;Electrical Stimulation;Functional mobility training;Ultrasound;Moist Heat;Therapeutic activities;Therapeutic exercise;Neuromuscular re-education;Patient/family education;Passive range of motion;Manual techniques;Dry needling;Taping   PT Next Visit Plan Wait unitl after MRI and MD appointment to resume PT.      Consulted and Agree with Plan of Care Patient      Patient will benefit from skilled therapeutic intervention in order to improve the following deficits and impairments:  Postural dysfunction, Improper body mechanics, Pain, Increased muscle spasms, Decreased endurance  Visit Diagnosis: Pain in right hip  Cramp and spasm  Chronic right-sided low back pain with right-sided sciatica     Problem List Patient Active Problem List   Diagnosis Date Noted  . TIA (transient ischemic attack) 05/15/2016  . Hyponatremia 05/15/2016  . Hypertension 05/15/2016  . Sciatica      Sigurd Sos, PT 08-25-16 12:25 PM G-codes: Mobility catergory CJ: goal status CK: D/C status PHYSICAL THERAPY DISCHARGE SUMMARY  Visits from Start of Care: 8  Current functional level related to goals / functional outcomes: See above for current status.   Remaining deficits: See above.  Pt didn't return to PT,   Education / Equipment: HEP Plan: Patient agrees to discharge.  Patient goals were partially met. Patient is being discharged due to not returning since the last visit.  ?????        Sigurd Sos, PT 09/16/16 12:06 PM  [facility] [address] [place], [place], [address] Phone: [phone]   Fax:  [phone]  Name: Erica House MRN: [id number] Date of Birth: September 04, 1935

## 2016-08-11 DIAGNOSIS — M791 Myalgia: Secondary | ICD-10-CM | POA: Diagnosis not present

## 2016-08-18 DIAGNOSIS — M4316 Spondylolisthesis, lumbar region: Secondary | ICD-10-CM | POA: Diagnosis not present

## 2016-08-18 DIAGNOSIS — M48061 Spinal stenosis, lumbar region without neurogenic claudication: Secondary | ICD-10-CM | POA: Diagnosis not present

## 2016-08-18 DIAGNOSIS — M791 Myalgia: Secondary | ICD-10-CM | POA: Diagnosis not present

## 2016-08-18 DIAGNOSIS — M79604 Pain in right leg: Secondary | ICD-10-CM | POA: Diagnosis not present

## 2016-09-22 DIAGNOSIS — M48061 Spinal stenosis, lumbar region without neurogenic claudication: Secondary | ICD-10-CM | POA: Diagnosis not present

## 2016-09-22 DIAGNOSIS — M545 Low back pain: Secondary | ICD-10-CM | POA: Diagnosis not present

## 2016-10-19 DIAGNOSIS — H43812 Vitreous degeneration, left eye: Secondary | ICD-10-CM | POA: Diagnosis not present

## 2016-11-02 DIAGNOSIS — Z23 Encounter for immunization: Secondary | ICD-10-CM | POA: Diagnosis not present

## 2016-11-23 DIAGNOSIS — H43812 Vitreous degeneration, left eye: Secondary | ICD-10-CM | POA: Diagnosis not present

## 2017-03-25 DIAGNOSIS — H60549 Acute eczematoid otitis externa, unspecified ear: Secondary | ICD-10-CM | POA: Diagnosis not present

## 2017-03-25 DIAGNOSIS — Z0000 Encounter for general adult medical examination without abnormal findings: Secondary | ICD-10-CM | POA: Diagnosis not present

## 2017-03-25 DIAGNOSIS — E785 Hyperlipidemia, unspecified: Secondary | ICD-10-CM | POA: Diagnosis not present

## 2017-03-25 DIAGNOSIS — Z1389 Encounter for screening for other disorder: Secondary | ICD-10-CM | POA: Diagnosis not present

## 2017-03-25 DIAGNOSIS — M810 Age-related osteoporosis without current pathological fracture: Secondary | ICD-10-CM | POA: Diagnosis not present

## 2017-03-25 DIAGNOSIS — G720 Drug-induced myopathy: Secondary | ICD-10-CM | POA: Diagnosis not present

## 2017-03-25 DIAGNOSIS — B351 Tinea unguium: Secondary | ICD-10-CM | POA: Diagnosis not present

## 2017-04-18 ENCOUNTER — Ambulatory Visit (INDEPENDENT_AMBULATORY_CARE_PROVIDER_SITE_OTHER): Payer: Medicare Other | Admitting: Podiatry

## 2017-04-18 ENCOUNTER — Encounter: Payer: Self-pay | Admitting: Podiatry

## 2017-04-18 DIAGNOSIS — B351 Tinea unguium: Secondary | ICD-10-CM | POA: Diagnosis not present

## 2017-04-18 NOTE — Patient Instructions (Signed)

## 2017-04-18 NOTE — Progress Notes (Signed)
Subjective:    Patient ID: Erica House, female    DOB: September 01, 1935, 82 y.o.   MRN: [id number]  [name] presents the office today for concerns of toenail fungus to several of her toenails.  She states this started after she had a pedicure in her feet and then started on the right fifth toe since this spread.  She has tried oregano oil as well as over-the-counter antifungal medicine without any significant improvement.  She denies any pain to the nails and she denies any redness or drainage or any swelling.  She has no other concerns today.  She is also currently helping with her daughter who just had a kidney transplant.  Review of Systems  All other systems reviewed and are negative.  Past Medical History:  Diagnosis Date  . Chronic bilateral low back pain   . Sciatica     History reviewed. No pertinent surgical history.   Current Outpatient Medications:  .  acetaminophen (TYLENOL) 325 MG tablet, Take 650 mg by mouth every 6 (six) hours as needed for mild pain., Disp: , Rfl:  .  Ascorbic Acid (VITAMIN C) 1000 MG tablet, Take 1,000 mg by mouth daily., Disp: , Rfl:  .  aspirin EC 81 MG tablet, Take 1 tablet (81 mg total) by mouth daily., Disp: 30 tablet, Rfl: 0 .  Biotin 5000 MCG CAPS, Take 5,000 mcg by mouth daily., Disp: , Rfl:  .  CALCIUM-VITAMIN D PO, Take 1 tablet by mouth daily., Disp: , Rfl:  .  docusate sodium (COLACE) 100 MG capsule, Take 100 mg by mouth at bedtime., Disp: , Rfl:  .  loratadine (CLARITIN) 10 MG tablet, Take 10 mg by mouth daily., Disp: , Rfl:  .  naproxen sodium (ANAPROX) 220 MG tablet, Take 220 mg by mouth 2 (two) times daily as needed (pain)., Disp: , Rfl:  .  rosuvastatin (CRESTOR) 5 MG tablet, Take 1 tablet (5 mg total) by mouth daily at 6 PM. (Patient not taking: Reported on 07/07/2016), Disp: 30 tablet, Rfl: 0 .  vitamin E 400 UNIT capsule, Take 400 Units by mouth daily., Disp: , Rfl:   Allergies  Allergen Reactions  . Azithromycin Diarrhea  .  Erythromycin Diarrhea  . Macrodantin [Nitrofurantoin Macrocrystal] Diarrhea  . Sulfa Antibiotics Diarrhea    Social History   Socioeconomic History  . Marital status: Widowed    Spouse name: Not on file  . Number of children: Not on file  . Years of education: Not on file  . Highest education level: Not on file  Occupational History  . Not on file  Social Needs  . Financial resource strain: Not on file  . Food insecurity:    Worry: Not on file    Inability: Not on file  . Transportation needs:    Medical: Not on file    Non-medical: Not on file  Tobacco Use  . Smoking status: Former Research scientist (life sciences)  . Smokeless tobacco: Former Network engineer and Sexual Activity  . Alcohol use: No  . Drug use: No  . Sexual activity: Not on file  Lifestyle  . Physical activity:    Days per week: Not on file    Minutes per session: Not on file  . Stress: Not on file  Relationships  . Social connections:    Talks on phone: Not on file    Gets together: Not on file    Attends religious service: Not on file    Active member of club  or organization: Not on file    Attends meetings of clubs or organizations: Not on file    Relationship status: Not on file  . Intimate partner violence:    Fear of current or ex partner: Not on file    Emotionally abused: Not on file    Physically abused: Not on file    Forced sexual activity: Not on file  Other Topics Concern  . Not on file  Social History Narrative  . Not on file         Objective:   Physical Exam  General: AAO x3, NAD  Dermatological: The left first, fourth, fifth and the right first and fifth toenails are hypertrophic, dystrophic, discolored with ill-defined discoloration in the right hallux toenails most hypertrophic.  There is no pain in the nails there is no surrounding redness or drainage or any clinical signs of infection noted today.  No open lesions are identified bilaterally or pre-ulcerative lesions.  Vascular: Dorsalis Pedis  artery and Posterior Tibial artery pedal pulses are 2/4 bilateral with immedate capillary fill time. Pedal hair growth present. No varicosities and no lower extremity edema present bilateral. There is no pain with calf compression, swelling, warmth, erythema.   Neruologic: Grossly intact via light touch bilateral. Protective threshold with Semmes Wienstein monofilament intact to all pedal sites bilateral.   Musculoskeletal: No gross boney pedal deformities bilateral. No pain, crepitus, or limitation noted with foot and ankle range of motion bilateral. Muscular strength 5/5 in all groups tested bilateral.  Gait: Unassisted, Nonantalgic.      Assessment & Plan:  82 year old female with onychomycosis bilaterally -Treatment options discussed including all alternatives, risks, and complications -Etiology of symptoms were discussed -We discussed treatment options in regards to repeat.  Discussed oral, topical, laser therapy.  She does not want to do oral therapy.  We discussed various topical ointments and I did order a compound cream through [employer].  We discussed the application instructions as well as side effects.  Should we not be able to do this because of cost we will try either Jublia or keratin.  She agrees this plan has no further questions I will see her back in 6 months or sooner if any issues are to arise.  Discussed will need to use the topical medicine for about a year if not see improvement in 6 months will change course of treatment.Trula Slade DPM

## 2017-04-19 ENCOUNTER — Telehealth: Payer: Self-pay | Admitting: *Deleted

## 2017-04-19 NOTE — Telephone Encounter (Signed)
I informed pt, the rx had been sent 1:35pm today.

## 2017-04-19 NOTE — Telephone Encounter (Signed)
Pt states she called [facility] [employer] and they stated they had not gotten the rx and pt states she was told it would be sent yesterday.

## 2017-08-08 DIAGNOSIS — M48061 Spinal stenosis, lumbar region without neurogenic claudication: Secondary | ICD-10-CM | POA: Diagnosis not present

## 2017-08-08 DIAGNOSIS — M545 Low back pain: Secondary | ICD-10-CM | POA: Diagnosis not present

## 2017-09-08 DIAGNOSIS — M5416 Radiculopathy, lumbar region: Secondary | ICD-10-CM | POA: Diagnosis not present

## 2017-09-27 DIAGNOSIS — H26492 Other secondary cataract, left eye: Secondary | ICD-10-CM | POA: Diagnosis not present

## 2017-09-27 DIAGNOSIS — H5203 Hypermetropia, bilateral: Secondary | ICD-10-CM | POA: Diagnosis not present

## 2017-09-29 DIAGNOSIS — M5416 Radiculopathy, lumbar region: Secondary | ICD-10-CM | POA: Diagnosis not present

## 2017-09-29 DIAGNOSIS — M4316 Spondylolisthesis, lumbar region: Secondary | ICD-10-CM | POA: Diagnosis not present

## 2017-10-13 DIAGNOSIS — M810 Age-related osteoporosis without current pathological fracture: Secondary | ICD-10-CM | POA: Diagnosis not present

## 2017-10-13 DIAGNOSIS — M48061 Spinal stenosis, lumbar region without neurogenic claudication: Secondary | ICD-10-CM | POA: Diagnosis not present

## 2017-10-13 DIAGNOSIS — M4316 Spondylolisthesis, lumbar region: Secondary | ICD-10-CM | POA: Diagnosis not present

## 2017-10-13 DIAGNOSIS — M5416 Radiculopathy, lumbar region: Secondary | ICD-10-CM | POA: Diagnosis not present

## 2017-10-24 DIAGNOSIS — Z23 Encounter for immunization: Secondary | ICD-10-CM | POA: Diagnosis not present

## 2017-11-04 DIAGNOSIS — M5416 Radiculopathy, lumbar region: Secondary | ICD-10-CM | POA: Diagnosis not present

## 2017-11-04 DIAGNOSIS — M810 Age-related osteoporosis without current pathological fracture: Secondary | ICD-10-CM | POA: Diagnosis not present

## 2017-11-04 DIAGNOSIS — M4807 Spinal stenosis, lumbosacral region: Secondary | ICD-10-CM | POA: Diagnosis not present

## 2017-11-04 DIAGNOSIS — M4316 Spondylolisthesis, lumbar region: Secondary | ICD-10-CM | POA: Diagnosis not present

## 2017-11-09 ENCOUNTER — Other Ambulatory Visit: Payer: Self-pay | Admitting: Neurological Surgery

## 2017-11-09 DIAGNOSIS — M810 Age-related osteoporosis without current pathological fracture: Secondary | ICD-10-CM

## 2017-11-09 DIAGNOSIS — M4807 Spinal stenosis, lumbosacral region: Secondary | ICD-10-CM

## 2017-11-15 DIAGNOSIS — M4316 Spondylolisthesis, lumbar region: Secondary | ICD-10-CM | POA: Diagnosis not present

## 2017-11-15 DIAGNOSIS — M5416 Radiculopathy, lumbar region: Secondary | ICD-10-CM | POA: Diagnosis not present

## 2017-11-21 ENCOUNTER — Ambulatory Visit
Admission: RE | Admit: 2017-11-21 | Discharge: 2017-11-21 | Disposition: A | Discharge status: Home / Self Care | Payer: Medicare Other | Source: Ambulatory Visit | Attending: Neurological Surgery | Admitting: Neurological Surgery

## 2017-11-21 ENCOUNTER — Other Ambulatory Visit: Payer: Medicare Other

## 2017-11-21 DIAGNOSIS — M810 Age-related osteoporosis without current pathological fracture: Secondary | ICD-10-CM | POA: Diagnosis not present

## 2017-11-23 ENCOUNTER — Ambulatory Visit
Admission: RE | Admit: 2017-11-23 | Discharge: 2017-11-23 | Disposition: A | Discharge status: Home / Self Care | Payer: Medicare Other | Source: Ambulatory Visit | Attending: Neurological Surgery | Admitting: Neurological Surgery

## 2017-11-23 DIAGNOSIS — M4807 Spinal stenosis, lumbosacral region: Secondary | ICD-10-CM

## 2017-11-23 DIAGNOSIS — M48061 Spinal stenosis, lumbar region without neurogenic claudication: Secondary | ICD-10-CM | POA: Diagnosis not present

## 2017-11-25 ENCOUNTER — Other Ambulatory Visit: Payer: Self-pay | Admitting: Neurological Surgery

## 2017-11-25 ENCOUNTER — Ambulatory Visit
Admission: RE | Admit: 2017-11-25 | Discharge: 2017-11-25 | Disposition: A | Discharge status: Home / Self Care | Payer: Medicare Other | Source: Ambulatory Visit | Attending: Neurological Surgery | Admitting: Neurological Surgery

## 2017-11-25 DIAGNOSIS — M4807 Spinal stenosis, lumbosacral region: Secondary | ICD-10-CM

## 2017-11-25 DIAGNOSIS — M5416 Radiculopathy, lumbar region: Secondary | ICD-10-CM

## 2017-11-25 DIAGNOSIS — M4316 Spondylolisthesis, lumbar region: Secondary | ICD-10-CM

## 2017-11-25 DIAGNOSIS — M545 Low back pain: Secondary | ICD-10-CM | POA: Diagnosis not present

## 2017-11-29 DIAGNOSIS — M5416 Radiculopathy, lumbar region: Secondary | ICD-10-CM | POA: Diagnosis not present

## 2017-11-29 DIAGNOSIS — M4316 Spondylolisthesis, lumbar region: Secondary | ICD-10-CM | POA: Diagnosis not present

## 2017-11-29 DIAGNOSIS — M810 Age-related osteoporosis without current pathological fracture: Secondary | ICD-10-CM | POA: Diagnosis not present

## 2017-11-29 DIAGNOSIS — M5431 Sciatica, right side: Secondary | ICD-10-CM | POA: Diagnosis not present

## 2018-02-13 DIAGNOSIS — R21 Rash and other nonspecific skin eruption: Secondary | ICD-10-CM | POA: Diagnosis not present

## 2018-02-13 DIAGNOSIS — R030 Elevated blood-pressure reading, without diagnosis of hypertension: Secondary | ICD-10-CM | POA: Diagnosis not present

## 2018-02-13 DIAGNOSIS — Z1389 Encounter for screening for other disorder: Secondary | ICD-10-CM | POA: Diagnosis not present

## 2018-03-16 DIAGNOSIS — Z23 Encounter for immunization: Secondary | ICD-10-CM | POA: Diagnosis not present

## 2018-03-16 DIAGNOSIS — B351 Tinea unguium: Secondary | ICD-10-CM | POA: Diagnosis not present

## 2018-03-16 DIAGNOSIS — L309 Dermatitis, unspecified: Secondary | ICD-10-CM | POA: Diagnosis not present

## 2018-09-05 DIAGNOSIS — Z8673 Personal history of transient ischemic attack (TIA), and cerebral infarction without residual deficits: Secondary | ICD-10-CM | POA: Diagnosis not present

## 2018-09-05 DIAGNOSIS — Z1389 Encounter for screening for other disorder: Secondary | ICD-10-CM | POA: Diagnosis not present

## 2018-09-05 DIAGNOSIS — Z0000 Encounter for general adult medical examination without abnormal findings: Secondary | ICD-10-CM | POA: Diagnosis not present

## 2018-09-05 DIAGNOSIS — E785 Hyperlipidemia, unspecified: Secondary | ICD-10-CM | POA: Diagnosis not present

## 2018-09-05 DIAGNOSIS — M810 Age-related osteoporosis without current pathological fracture: Secondary | ICD-10-CM | POA: Diagnosis not present

## 2018-09-05 DIAGNOSIS — G720 Drug-induced myopathy: Secondary | ICD-10-CM | POA: Diagnosis not present

## 2018-09-06 DIAGNOSIS — E785 Hyperlipidemia, unspecified: Secondary | ICD-10-CM | POA: Diagnosis not present

## 2018-10-03 DIAGNOSIS — H524 Presbyopia: Secondary | ICD-10-CM | POA: Diagnosis not present

## 2018-10-03 DIAGNOSIS — H43813 Vitreous degeneration, bilateral: Secondary | ICD-10-CM | POA: Diagnosis not present

## 2019-05-07 DIAGNOSIS — R05 Cough: Secondary | ICD-10-CM | POA: Diagnosis not present

## 2019-05-07 DIAGNOSIS — U071 COVID-19: Secondary | ICD-10-CM | POA: Diagnosis not present

## 2019-05-13 ENCOUNTER — Other Ambulatory Visit: Payer: Self-pay | Admitting: Nurse Practitioner

## 2019-05-13 ENCOUNTER — Telehealth: Payer: Self-pay | Admitting: Nurse Practitioner

## 2019-05-13 DIAGNOSIS — U071 COVID-19: Secondary | ICD-10-CM

## 2019-05-13 DIAGNOSIS — I10 Essential (primary) hypertension: Secondary | ICD-10-CM

## 2019-05-13 MED ORDER — SODIUM CHLORIDE 0.9 % IV SOLN
Freq: Once | INTRAVENOUS | Status: AC
  Filled 2019-05-13: qty 700

## 2019-05-13 NOTE — Telephone Encounter (Signed)
Called to discuss with Leighton Ruff about Covid symptoms and the use of bamlanivimab, a monoclonal antibody infusion for those with mild to moderate Covid symptoms and at a high risk of hospitalization.    Referral received from Dr. Dema Severin [facility]) confirming patient's positive test on 05/07/19.   Pt is qualified for this infusion at the [facility] infusion center due to co-morbid conditions (hypertension, age >40). She reports initial symptoms of fever, chills, and aches on 05/06/19.   Patient and daughter verbalized understanding of infusion and appointment details. Appointment scheduled for 05/14/19 @ 0930. MyChart message sent as requested.   Patient Active Problem List   Diagnosis Date Noted  . TIA (transient ischemic attack) 05/15/2016  . Hyponatremia 05/15/2016  . Hypertension 05/15/2016  . Sciatica     Alda Lea, AGPCNP-BC Pager: [phone] Amion: N. Cousar

## 2019-05-13 NOTE — Progress Notes (Signed)
  I connected by phone with Erica House on 05/13/2019 at 12:27 PM to discuss the potential use of an new treatment for mild to moderate COVID-19 viral infection in non-hospitalized patients.  This patient is a 84 y.o. female that meets the FDA criteria for Emergency Use Authorization of bamlanivimab/etesevimab or casirivimab/imdevimab.  Has a (+) direct SARS-CoV-2 viral test result  Has mild or moderate COVID-19   Is ? 84 years of age and weighs ? 40 kg  Is NOT hospitalized due to COVID-19  Is NOT requiring oxygen therapy or requiring an increase in baseline oxygen flow rate due to COVID-19  Is within 10 days of symptom onset  Has at least one of the high risk factor(s) for progression to severe COVID-19 and/or hospitalization as defined in EUA.  Specific high risk criteria : >/= 83 yo and hypertension.    I have spoken and communicated the following to the patient or parent/caregiver:  1. FDA has authorized the emergency use of bamlanivimab/etesevimab and casirivimab\imdevimab for the treatment of mild to moderate COVID-19 in adults and pediatric patients with positive results of direct SARS-CoV-2 viral testing who are 34 years of age and older weighing at least 40 kg, and who are at high risk for progressing to severe COVID-19 and/or hospitalization.  2. The significant known and potential risks and benefits of bamlanivimab/etesevimab and casirivimab\imdevimab, and the extent to which such potential risks and benefits are unknown.  3. Information on available alternative treatments and the risks and benefits of those alternatives, including clinical trials.  4. Patients treated with bamlanivimab/etesevimab and casirivimab\imdevimab should continue to self-isolate and use infection control measures (e.g., wear mask, isolate, social distance, avoid sharing personal items, clean and disinfect "high touch" surfaces, and frequent handwashing) according to CDC guidelines.   5. The patient  or parent/caregiver has the option to accept or refuse bamlanivimab/etesevimab or casirivimab\imdevimab .  After reviewing this information with the patient, The patient agreed to proceed with receiving the bamlanimivab infusion and will be provided a copy of the Fact sheet prior to receiving the infusion.Chesley Noon Pickenpack-Cousar 05/13/2019 12:27 PM

## 2019-05-14 ENCOUNTER — Other Ambulatory Visit (HOSPITAL_COMMUNITY): Payer: Self-pay | Admitting: Family Medicine

## 2019-05-14 ENCOUNTER — Ambulatory Visit (HOSPITAL_COMMUNITY)
Admission: RE | Admit: 2019-05-14 | Discharge: 2019-05-14 | Disposition: A | Discharge status: Home / Self Care | Payer: Medicare Other | Source: Ambulatory Visit | Attending: Pulmonary Disease | Admitting: Pulmonary Disease

## 2019-05-14 DIAGNOSIS — U071 COVID-19: Secondary | ICD-10-CM | POA: Diagnosis not present

## 2019-05-14 DIAGNOSIS — I10 Essential (primary) hypertension: Secondary | ICD-10-CM | POA: Diagnosis not present

## 2019-05-14 DIAGNOSIS — Z23 Encounter for immunization: Secondary | ICD-10-CM | POA: Insufficient documentation

## 2019-05-14 MED ORDER — FAMOTIDINE IN NACL 20-0.9 MG/50ML-% IV SOLN: 20.0000 mg | Freq: Once | INTRAVENOUS | Status: DC | PRN

## 2019-05-14 MED ORDER — ALBUTEROL SULFATE HFA 108 (90 BASE) MCG/ACT IN AERS: 2.0000 | INHALATION_SPRAY | Freq: Once | RESPIRATORY_TRACT | Status: DC | PRN

## 2019-05-14 MED ORDER — SODIUM CHLORIDE 0.9 % IV SOLN
INTRAVENOUS | Status: DC | PRN
  Administered 2019-05-14: 09:00:00 250 mL via INTRAVENOUS

## 2019-05-14 MED ORDER — EPINEPHRINE 0.3 MG/0.3ML IJ SOAJ: 0.3000 mg | Freq: Once | INTRAMUSCULAR | Status: DC | PRN

## 2019-05-14 MED ORDER — METHYLPREDNISOLONE SODIUM SUCC 125 MG IJ SOLR: 125.0000 mg | Freq: Once | INTRAMUSCULAR | Status: DC | PRN

## 2019-05-14 MED ORDER — DIPHENHYDRAMINE HCL 50 MG/ML IJ SOLN: 50.0000 mg | Freq: Once | INTRAMUSCULAR | Status: DC | PRN

## 2019-05-14 NOTE — Progress Notes (Signed)
Advised patient to follow up  BP with primary Md  Verbalized understanding

## 2019-05-14 NOTE — Discharge Instructions (Signed)

## 2019-05-14 NOTE — Progress Notes (Signed)
  Diagnosis: COVID-19  Physician: Dr. Joya Gaskins  Procedure: [facility]fusion [facility] Med: bamlanivimab\etesevimab infusion - Provided patient with bamlanimivab\etesevimab fact sheet for patients, parents and caregivers prior to infusion.  Complications: No immediate complications noted.  Discharge: Discharged home   Acquanetta Chain 05/14/2019

## 2019-06-25 ENCOUNTER — Telehealth: Payer: Self-pay | Admitting: *Deleted

## 2019-06-25 NOTE — Telephone Encounter (Signed)
Pt states she had good results with the topical on the toenail fungus, but had noticed some on the toenail again. I told pt I would have to ask Dr. Jacqualyn Posey because it had been over 2 years. Pt states understanding, and she has not met her deductible yet and was looking at a high deductible.

## 2019-06-25 NOTE — Telephone Encounter (Signed)
Pt called for a refill of medication.

## 2019-06-27 NOTE — Telephone Encounter (Signed)
Sent the nail lacquer to [employer]. Lattie Haw

## 2019-06-27 NOTE — Telephone Encounter (Signed)
Ok to refill compound cream from [place] but she should come in to be seen as its been over 2 years.

## 2019-08-14 DIAGNOSIS — Z23 Encounter for immunization: Secondary | ICD-10-CM | POA: Diagnosis not present

## 2019-09-04 ENCOUNTER — Ambulatory Visit (INDEPENDENT_AMBULATORY_CARE_PROVIDER_SITE_OTHER): Payer: Medicare Other | Admitting: Podiatry

## 2019-09-04 ENCOUNTER — Encounter: Payer: Self-pay | Admitting: Podiatry

## 2019-09-04 ENCOUNTER — Other Ambulatory Visit: Payer: Self-pay

## 2019-09-04 DIAGNOSIS — B351 Tinea unguium: Secondary | ICD-10-CM

## 2019-09-05 DIAGNOSIS — Z23 Encounter for immunization: Secondary | ICD-10-CM | POA: Diagnosis not present

## 2019-09-11 DIAGNOSIS — G720 Drug-induced myopathy: Secondary | ICD-10-CM | POA: Diagnosis not present

## 2019-09-11 DIAGNOSIS — Z8673 Personal history of transient ischemic attack (TIA), and cerebral infarction without residual deficits: Secondary | ICD-10-CM | POA: Diagnosis not present

## 2019-09-11 DIAGNOSIS — M810 Age-related osteoporosis without current pathological fracture: Secondary | ICD-10-CM | POA: Diagnosis not present

## 2019-09-11 DIAGNOSIS — Z0000 Encounter for general adult medical examination without abnormal findings: Secondary | ICD-10-CM | POA: Diagnosis not present

## 2019-09-11 DIAGNOSIS — Z1389 Encounter for screening for other disorder: Secondary | ICD-10-CM | POA: Diagnosis not present

## 2019-09-11 DIAGNOSIS — B351 Tinea unguium: Secondary | ICD-10-CM | POA: Insufficient documentation

## 2019-09-11 DIAGNOSIS — E785 Hyperlipidemia, unspecified: Secondary | ICD-10-CM | POA: Diagnosis not present

## 2019-09-11 NOTE — Progress Notes (Signed)
Subjective: 84 year old female presents the office of follow-up evaluation of toenail fungus.  She states that she is still using the topical compound ointment from [place] and this has been doing well.  She has no pain in the nails no redness or drainage or any signs of infection. Denies any systemic complaints such as fevers, chills, nausea, vomiting. No acute changes since last appointment, and no other complaints at this time.   Objective: AAO x3, NAD DP/PT pulses palpable bilaterally, CRT less than 3 seconds Nails continue be hypertrophic, dystrophic, discolored however does appear to be improved and there is clear on the proximal nail fold.  There is no pain in the nails and there is no redness or drainage or signs of infection No pain with calf compression, swelling, warmth, erythema  Assessment: Onychomycosis, currently on topical treatment   Plan: -All treatment options discussed with the patient including all alternatives, risks, complications.  -As a courtesy debrided the nails with any complications or bleeding.  Continue topical treatment.  She wished to hold off on any oral medications. -Patient encouraged to call the office with any questions, concerns, change in symptoms.   Trula Slade DPM

## 2019-10-05 DIAGNOSIS — H524 Presbyopia: Secondary | ICD-10-CM | POA: Diagnosis not present

## 2019-10-05 DIAGNOSIS — Z961 Presence of intraocular lens: Secondary | ICD-10-CM | POA: Diagnosis not present

## 2020-05-09 DIAGNOSIS — R7989 Other specified abnormal findings of blood chemistry: Secondary | ICD-10-CM | POA: Diagnosis not present

## 2020-05-09 DIAGNOSIS — E785 Hyperlipidemia, unspecified: Secondary | ICD-10-CM | POA: Diagnosis not present

## 2020-05-09 DIAGNOSIS — I10 Essential (primary) hypertension: Secondary | ICD-10-CM | POA: Diagnosis not present

## 2020-05-09 DIAGNOSIS — R278 Other lack of coordination: Secondary | ICD-10-CM | POA: Diagnosis not present

## 2020-05-09 DIAGNOSIS — Z8673 Personal history of transient ischemic attack (TIA), and cerebral infarction without residual deficits: Secondary | ICD-10-CM | POA: Diagnosis not present

## 2020-05-09 DIAGNOSIS — H60549 Acute eczematoid otitis externa, unspecified ear: Secondary | ICD-10-CM | POA: Diagnosis not present

## 2020-05-09 DIAGNOSIS — R410 Disorientation, unspecified: Secondary | ICD-10-CM | POA: Diagnosis not present

## 2020-05-12 ENCOUNTER — Other Ambulatory Visit: Payer: Self-pay | Admitting: Family Medicine

## 2020-05-12 DIAGNOSIS — I10 Essential (primary) hypertension: Secondary | ICD-10-CM

## 2020-05-12 DIAGNOSIS — Z8673 Personal history of transient ischemic attack (TIA), and cerebral infarction without residual deficits: Secondary | ICD-10-CM

## 2020-05-12 DIAGNOSIS — R410 Disorientation, unspecified: Secondary | ICD-10-CM

## 2020-05-12 DIAGNOSIS — E785 Hyperlipidemia, unspecified: Secondary | ICD-10-CM

## 2020-05-15 ENCOUNTER — Ambulatory Visit
Admission: RE | Admit: 2020-05-15 | Discharge: 2020-05-15 | Disposition: A | Discharge status: Home / Self Care | Payer: Medicare Other | Source: Ambulatory Visit | Attending: Family Medicine | Admitting: Family Medicine

## 2020-05-15 DIAGNOSIS — Z8673 Personal history of transient ischemic attack (TIA), and cerebral infarction without residual deficits: Secondary | ICD-10-CM

## 2020-05-15 DIAGNOSIS — E785 Hyperlipidemia, unspecified: Secondary | ICD-10-CM

## 2020-05-15 DIAGNOSIS — R410 Disorientation, unspecified: Secondary | ICD-10-CM

## 2020-05-15 DIAGNOSIS — I10 Essential (primary) hypertension: Secondary | ICD-10-CM

## 2020-05-15 DIAGNOSIS — I6523 Occlusion and stenosis of bilateral carotid arteries: Secondary | ICD-10-CM | POA: Diagnosis not present

## 2020-05-16 ENCOUNTER — Other Ambulatory Visit: Payer: Self-pay | Admitting: Family Medicine

## 2020-05-16 DIAGNOSIS — R278 Other lack of coordination: Secondary | ICD-10-CM

## 2020-05-16 DIAGNOSIS — R410 Disorientation, unspecified: Secondary | ICD-10-CM

## 2020-05-16 DIAGNOSIS — Z8673 Personal history of transient ischemic attack (TIA), and cerebral infarction without residual deficits: Secondary | ICD-10-CM

## 2020-05-27 ENCOUNTER — Ambulatory Visit
Admission: RE | Admit: 2020-05-27 | Discharge: 2020-05-27 | Disposition: A | Discharge status: Home / Self Care | Payer: Medicare Other | Source: Ambulatory Visit | Attending: Family Medicine | Admitting: Family Medicine

## 2020-05-27 DIAGNOSIS — Z8673 Personal history of transient ischemic attack (TIA), and cerebral infarction without residual deficits: Secondary | ICD-10-CM

## 2020-05-27 DIAGNOSIS — I6381 Other cerebral infarction due to occlusion or stenosis of small artery: Secondary | ICD-10-CM | POA: Diagnosis not present

## 2020-05-27 DIAGNOSIS — G319 Degenerative disease of nervous system, unspecified: Secondary | ICD-10-CM | POA: Diagnosis not present

## 2020-05-27 DIAGNOSIS — R410 Disorientation, unspecified: Secondary | ICD-10-CM

## 2020-05-27 DIAGNOSIS — I6782 Cerebral ischemia: Secondary | ICD-10-CM | POA: Diagnosis not present

## 2020-05-27 DIAGNOSIS — R278 Other lack of coordination: Secondary | ICD-10-CM

## 2020-06-02 ENCOUNTER — Telehealth: Payer: Self-pay | Admitting: *Deleted

## 2020-06-02 ENCOUNTER — Other Ambulatory Visit: Payer: Self-pay | Admitting: Podiatry

## 2020-06-02 NOTE — Telephone Encounter (Signed)
Patient is calling for a refill on the nail lacquer(nail fungus)the prescription has expired. Please advise.

## 2020-06-02 NOTE — Progress Notes (Signed)
error 

## 2020-06-02 NOTE — Telephone Encounter (Signed)
Erica House- can you please resend the nail Laquer from [place] for nail fungus? Thanks!

## 2020-06-04 NOTE — Telephone Encounter (Signed)
I called [employer] today and they have the form and will be contacting the patient. Lattie Haw

## 2020-06-04 NOTE — Telephone Encounter (Signed)
Patient is calling on the status of a refill of the antifungal nail lacquer. She has not heard anything.  Returned the call to patient after calling [facility] to patient that they are processing the order now and will call her this afternoon when ready to ship. She verbalized understanding and wanted to thank Dr Jacqualyn Posey for the prescription.

## 2020-07-10 DIAGNOSIS — E785 Hyperlipidemia, unspecified: Secondary | ICD-10-CM | POA: Diagnosis not present

## 2020-09-16 DIAGNOSIS — H43813 Vitreous degeneration, bilateral: Secondary | ICD-10-CM | POA: Diagnosis not present

## 2020-09-16 DIAGNOSIS — H524 Presbyopia: Secondary | ICD-10-CM | POA: Diagnosis not present

## 2020-09-25 DIAGNOSIS — L988 Other specified disorders of the skin and subcutaneous tissue: Secondary | ICD-10-CM | POA: Diagnosis not present

## 2020-09-25 DIAGNOSIS — D485 Neoplasm of uncertain behavior of skin: Secondary | ICD-10-CM | POA: Diagnosis not present

## 2020-09-25 DIAGNOSIS — L859 Epidermal thickening, unspecified: Secondary | ICD-10-CM | POA: Diagnosis not present

## 2020-09-26 DIAGNOSIS — B351 Tinea unguium: Secondary | ICD-10-CM | POA: Diagnosis not present

## 2020-09-26 DIAGNOSIS — E785 Hyperlipidemia, unspecified: Secondary | ICD-10-CM | POA: Diagnosis not present

## 2020-09-26 DIAGNOSIS — Z1389 Encounter for screening for other disorder: Secondary | ICD-10-CM | POA: Diagnosis not present

## 2020-09-26 DIAGNOSIS — M810 Age-related osteoporosis without current pathological fracture: Secondary | ICD-10-CM | POA: Diagnosis not present

## 2020-09-26 DIAGNOSIS — Z23 Encounter for immunization: Secondary | ICD-10-CM | POA: Diagnosis not present

## 2020-09-26 DIAGNOSIS — I10 Essential (primary) hypertension: Secondary | ICD-10-CM | POA: Diagnosis not present

## 2020-09-26 DIAGNOSIS — Z0000 Encounter for general adult medical examination without abnormal findings: Secondary | ICD-10-CM | POA: Diagnosis not present

## 2020-09-26 DIAGNOSIS — Z8673 Personal history of transient ischemic attack (TIA), and cerebral infarction without residual deficits: Secondary | ICD-10-CM | POA: Diagnosis not present

## 2021-03-25 DIAGNOSIS — E785 Hyperlipidemia, unspecified: Secondary | ICD-10-CM | POA: Diagnosis not present

## 2021-03-25 DIAGNOSIS — I10 Essential (primary) hypertension: Secondary | ICD-10-CM | POA: Diagnosis not present

## 2021-08-01 ENCOUNTER — Emergency Department (HOSPITAL_COMMUNITY)
Admission: EM | Admit: 2021-08-01 | Discharge: 2021-08-01 | Disposition: A | Discharge status: Home / Self Care | Payer: Medicare Other | Attending: Emergency Medicine | Admitting: Emergency Medicine

## 2021-08-01 ENCOUNTER — Emergency Department (HOSPITAL_COMMUNITY): Payer: Medicare Other

## 2021-08-01 ENCOUNTER — Other Ambulatory Visit: Payer: Self-pay

## 2021-08-01 ENCOUNTER — Encounter (HOSPITAL_COMMUNITY): Payer: Self-pay

## 2021-08-01 DIAGNOSIS — I639 Cerebral infarction, unspecified: Secondary | ICD-10-CM | POA: Insufficient documentation

## 2021-08-01 DIAGNOSIS — I10 Essential (primary) hypertension: Secondary | ICD-10-CM | POA: Insufficient documentation

## 2021-08-01 DIAGNOSIS — R200 Anesthesia of skin: Secondary | ICD-10-CM

## 2021-08-01 DIAGNOSIS — Z7982 Long term (current) use of aspirin: Secondary | ICD-10-CM | POA: Diagnosis not present

## 2021-08-01 DIAGNOSIS — R209 Unspecified disturbances of skin sensation: Secondary | ICD-10-CM | POA: Diagnosis not present

## 2021-08-01 DIAGNOSIS — R0981 Nasal congestion: Secondary | ICD-10-CM | POA: Diagnosis not present

## 2021-08-01 DIAGNOSIS — I2510 Atherosclerotic heart disease of native coronary artery without angina pectoris: Secondary | ICD-10-CM | POA: Diagnosis not present

## 2021-08-01 DIAGNOSIS — M4316 Spondylolisthesis, lumbar region: Secondary | ICD-10-CM | POA: Diagnosis not present

## 2021-08-01 DIAGNOSIS — R110 Nausea: Secondary | ICD-10-CM | POA: Diagnosis not present

## 2021-08-01 DIAGNOSIS — M542 Cervicalgia: Secondary | ICD-10-CM | POA: Insufficient documentation

## 2021-08-01 DIAGNOSIS — M546 Pain in thoracic spine: Secondary | ICD-10-CM

## 2021-08-01 DIAGNOSIS — R61 Generalized hyperhidrosis: Secondary | ICD-10-CM | POA: Diagnosis not present

## 2021-08-01 DIAGNOSIS — M47814 Spondylosis without myelopathy or radiculopathy, thoracic region: Secondary | ICD-10-CM | POA: Diagnosis not present

## 2021-08-01 DIAGNOSIS — K449 Diaphragmatic hernia without obstruction or gangrene: Secondary | ICD-10-CM | POA: Diagnosis not present

## 2021-08-01 DIAGNOSIS — R29898 Other symptoms and signs involving the musculoskeletal system: Secondary | ICD-10-CM | POA: Diagnosis not present

## 2021-08-01 DIAGNOSIS — R531 Weakness: Secondary | ICD-10-CM | POA: Diagnosis not present

## 2021-08-01 DIAGNOSIS — K429 Umbilical hernia without obstruction or gangrene: Secondary | ICD-10-CM | POA: Diagnosis not present

## 2021-08-01 DIAGNOSIS — K5730 Diverticulosis of large intestine without perforation or abscess without bleeding: Secondary | ICD-10-CM | POA: Diagnosis not present

## 2021-08-01 DIAGNOSIS — R202 Paresthesia of skin: Secondary | ICD-10-CM | POA: Diagnosis not present

## 2021-08-01 DIAGNOSIS — I701 Atherosclerosis of renal artery: Secondary | ICD-10-CM | POA: Diagnosis not present

## 2021-08-01 LAB — COMPREHENSIVE METABOLIC PANEL
ALT: 16 U/L (ref 0–44)
AST: 25 U/L (ref 15–41)
Albumin: 4.4 g/dL (ref 3.5–5.0)
Alkaline Phosphatase: 82 U/L (ref 38–126)
Anion gap: 14 (ref 5–15)
BUN: 13 mg/dL (ref 8–23)
CO2: 21 mmol/L — ABNORMAL LOW (ref 22–32)
Calcium: 9.6 mg/dL (ref 8.9–10.3)
Chloride: 95 mmol/L — ABNORMAL LOW (ref 98–111)
Creatinine, Ser: 0.96 mg/dL (ref 0.44–1.00)
GFR, Estimated: 59 mL/min — ABNORMAL LOW (ref >60)
Glucose, Bld: 113 mg/dL — ABNORMAL HIGH (ref 70–99)
Potassium: 4.1 mmol/L (ref 3.5–5.1)
Sodium: 130 mmol/L — ABNORMAL LOW (ref 135–145)
Total Bilirubin: 0.8 mg/dL (ref 0.3–1.2)
Total Protein: 7.5 g/dL (ref 6.5–8.1)

## 2021-08-01 LAB — CBC WITH DIFFERENTIAL/PLATELET
Abs Immature Granulocytes: 0.02 10*3/uL (ref 0.00–0.07)
Basophils Absolute: 0.1 10*3/uL (ref 0.0–0.1)
Basophils Relative: 1 %
Eosinophils Absolute: 0.1 10*3/uL (ref 0.0–0.5)
Eosinophils Relative: 1 %
HCT: 37.5 % (ref 36.0–46.0)
Hemoglobin: 12.7 g/dL (ref 12.0–15.0)
Immature Granulocytes: 0 %
Lymphocytes Relative: 17 %
Lymphs Abs: 1.3 10*3/uL (ref 0.7–4.0)
MCH: 31.8 pg (ref 26.0–34.0)
MCHC: 33.9 g/dL (ref 30.0–36.0)
MCV: 94 fL (ref 80.0–100.0)
Monocytes Absolute: 0.4 10*3/uL (ref 0.1–1.0)
Monocytes Relative: 5 %
Neutro Abs: 5.9 10*3/uL (ref 1.7–7.7)
Neutrophils Relative %: 76 %
Platelets: 290 10*3/uL (ref 150–400)
RBC: 3.99 MIL/uL (ref 3.87–5.11)
RDW: 14 % (ref 11.5–15.5)
WBC: 7.7 10*3/uL (ref 4.0–10.5)
nRBC: 0 % (ref 0.0–0.2)

## 2021-08-01 LAB — URINALYSIS, ROUTINE W REFLEX MICROSCOPIC
Bilirubin Urine: NEGATIVE
Glucose, UA: NEGATIVE mg/dL
Hgb urine dipstick: NEGATIVE
Ketones, ur: NEGATIVE mg/dL
Leukocytes,Ua: NEGATIVE
Nitrite: NEGATIVE
Protein, ur: NEGATIVE mg/dL
Specific Gravity, Urine: 1.003 — ABNORMAL LOW (ref 1.005–1.030)
pH: 7 (ref 5.0–8.0)

## 2021-08-01 LAB — TROPONIN I (HIGH SENSITIVITY)
Troponin I (High Sensitivity): 6 ng/L (ref <18)
Troponin I (High Sensitivity): 7 ng/L (ref <18)

## 2021-08-01 LAB — LACTIC ACID, PLASMA
Lactic Acid, Venous: 1.3 mmol/L (ref 0.5–1.9)
Lactic Acid, Venous: 0.9 mmol/L (ref 0.5–1.9)

## 2021-08-01 LAB — LIPASE, BLOOD: Lipase: 28 U/L (ref 11–51)

## 2021-08-01 MED ORDER — IOHEXOL 350 MG/ML SOLN
75.0000 mL | Freq: Once | INTRAVENOUS | Status: AC | PRN
  Administered 2021-08-01: 75 mL via INTRAVENOUS

## 2021-08-01 NOTE — ED Provider Notes (Signed)
[facility] EMERGENCY DEPARTMENT Provider Note   CSN: [id number] Arrival date & time: 08/01/21  1634     History  No chief complaint on file.   Erica House is a 86 y.o. female.  The history is provided by the patient and medical records. No language interpreter was used.  Neurologic Problem The current episode started 1 to 2 hours ago. The problem has been resolved. Pertinent negatives include no chest pain, no abdominal pain, no headaches and no shortness of breath. Associated symptoms comments: Back pain . Nothing aggravates the symptoms. Nothing relieves the symptoms. She has tried nothing for the symptoms. The treatment provided no relief.       Home Medications Prior to Admission medications   Medication Sig Start Date End Date Taking? Authorizing Provider  acetaminophen (TYLENOL) 325 MG tablet Take 650 mg by mouth every 6 (six) hours as needed for mild pain.    [provider]  Ascorbic Acid (VITAMIN C) 1000 MG tablet Take 1,000 mg by mouth daily.    [provider]  aspirin EC 81 MG tablet Take 1 tablet (81 mg total) by mouth daily. 05/16/16   Rosita Fire, MD  Biotin 5000 MCG CAPS Take 5,000 mcg by mouth daily.    [provider]  Calcium Carb-Cholecalciferol 500-400 MG-UNIT CHEW Calcium 500 + D    [provider]  CALCIUM-VITAMIN D PO Take 1 tablet by mouth daily.    [provider]  docusate sodium (COLACE) 100 MG capsule Take 100 mg by mouth at bedtime.    [provider]  Influenza vac split quadrivalent PF (FLUZONE HIGH-DOSE) 0.5 ML injection Fluzone High-Dose 2019-20 (PF) 180 mcg/0.5 mL intramuscular syringe  TO BE ADMINISTERED BY PHARMACIST FOR IMMUNIZATION    [provider]  loratadine (CLARITIN) 10 MG tablet Take 10 mg by mouth daily.    [provider]  NON FORMULARY [facility]  Onychomycosis Nail Lacquer -  Fluconazole 2%, Terbinafine 1% DMSO Apply to  affected nail once daily Qty. 120 gm 3 refills    [provider]  NON FORMULARY [facility]  Antifungal (nail)-#1    [provider]  triamcinolone ointment (KENALOG) 0.1 % triamcinolone acetonide 0.1 % topical ointment 03/25/17   [provider]  vitamin E 400 UNIT capsule Take 400 Units by mouth daily.    [provider]      Allergies    Azithromycin, Erythromycin, Macrodantin [nitrofurantoin macrocrystal], and Sulfa antibiotics    Review of Systems   Review of Systems  Constitutional:  Positive for diaphoresis. Negative for chills, fatigue and fever.  HENT:  Negative for congestion.   Eyes:  Negative for visual disturbance.  Respiratory:  Negative for cough, chest tightness, shortness of breath and wheezing.   Cardiovascular:  Negative for chest pain, palpitations and leg swelling.  Gastrointestinal:  Positive for nausea. Negative for abdominal pain, constipation, diarrhea and vomiting.  Genitourinary:  Negative for dysuria and flank pain.  Musculoskeletal:  Positive for back pain. Negative for neck pain and neck stiffness.  Skin:  Negative for rash and wound.  Neurological:  Positive for numbness (resolved now). Negative for dizziness, syncope, speech difficulty, weakness, light-headedness and headaches.  Psychiatric/Behavioral:  Negative for agitation and confusion.   All other systems reviewed and are negative.   Physical Exam Updated Vital Signs There were no vitals taken for this visit. Physical Exam Vitals and nursing note reviewed.  Constitutional:      General: She  is not in acute distress.    Appearance: She is well-developed. She is not ill-appearing, toxic-appearing or diaphoretic.  HENT:     Head: Normocephalic and atraumatic.     Nose: Congestion present.     Mouth/Throat:     Mouth: Mucous membranes are moist.     Pharynx: No oropharyngeal exudate or posterior oropharyngeal erythema.  Eyes:     Extraocular  Movements: Extraocular movements intact.     Conjunctiva/sclera: Conjunctivae normal.     Pupils: Pupils are equal, round, and reactive to light.  Cardiovascular:     Rate and Rhythm: Normal rate and regular rhythm.     Heart sounds: No murmur heard. Pulmonary:     Effort: Pulmonary effort is normal. No respiratory distress.     Breath sounds: Normal breath sounds. No wheezing or rhonchi.  Chest:     Chest wall: No tenderness.  Abdominal:     General: There is no distension.     Palpations: Abdomen is soft.     Tenderness: There is no abdominal tenderness. There is no right CVA tenderness, left CVA tenderness, guarding or rebound.  Musculoskeletal:        General: No swelling or tenderness.     Cervical back: Neck supple. No tenderness.     Right lower leg: No edema.     Left lower leg: No edema.  Skin:    General: Skin is warm and dry.     Capillary Refill: Capillary refill takes less than 2 seconds.     Findings: No erythema or rash.  Neurological:     General: No focal deficit present.     Mental Status: She is alert.     Sensory: No sensory deficit.     Motor: No weakness.  Psychiatric:        Mood and Affect: Mood normal.     ED Results / Procedures / Treatments   Labs (all labs ordered are listed, but only abnormal results are displayed) Labs Reviewed  COMPREHENSIVE METABOLIC PANEL - Abnormal; Notable for the following components:      Result Value   Sodium 130 (*)    Chloride 95 (*)    CO2 21 (*)    Glucose, Bld 113 (*)    GFR, Estimated 59 (*)    All other components within normal limits  URINALYSIS, ROUTINE W REFLEX MICROSCOPIC - Abnormal; Notable for the following components:   Color, Urine COLORLESS (*)    Specific Gravity, Urine 1.003 (*)    All other components within normal limits  CBC WITH DIFFERENTIAL/PLATELET  LIPASE, BLOOD  LACTIC ACID, PLASMA  LACTIC ACID, PLASMA  TROPONIN I (HIGH SENSITIVITY)  TROPONIN I (HIGH SENSITIVITY)    EKG EKG  Interpretation  Date/Time:  Saturday August 01 2021 16:41:19 EDT Ventricular Rate:  99 PR Interval:  118 QRS Duration: 104 QT Interval:  341 QTC Calculation: 438 R Axis:   80 Text Interpretation: Sinus rhythm when compared to prior, faster rate. no STEMI Confirmed by Antony Blackbird [phone]) on 08/01/2021 6:38:00 PM  Radiology CT Angio Chest/Abd/Pel for Dissection W and/or Wo Contrast  Result Date: 08/01/2021 CLINICAL DATA:  Chest pain or back pain, aortic dissection suspected Severe pain between shoulder blades and mid back with diaphoresis followed by right leg numbness. Rule out dissection EXAM: CT ANGIOGRAPHY CHEST, ABDOMEN AND PELVIS TECHNIQUE: Non-contrast CT of the chest was initially obtained. Multidetector CT imaging through the chest, abdomen and pelvis was performed using the standard protocol during bolus  administration of intravenous contrast. Multiplanar reconstructed images and MIPs were obtained and reviewed to evaluate the vascular anatomy. RADIATION DOSE REDUCTION: This exam was performed according to the departmental dose-optimization program which includes automated exposure control, adjustment of the mA and/or kV according to patient size and/or use of iterative reconstruction technique. CONTRAST:  54m OMNIPAQUE IOHEXOL 350 MG/ML SOLN COMPARISON:  None Available. FINDINGS: CTA CHEST FINDINGS Cardiovascular: No aortic hematoma noncontrast exam. There is no aortic aneurysm. Diffuse calcified and noncalcified atheromatous plaque throughout the thoracic aorta. Plaque in the descending aorta is irregular a small outpouching of contrast into peripheral thrombus series 7, image 95 consistent with ulcerated plaque. No periaortic stranding or contrast extending beyond the aortic lumen to suggest penetrating ulcer. No aortic dissection. Heart is normal in size. Coronary artery calcifications. No pericardial effusion. There is no pulmonary embolus to the segmental level. Mediastinum/Nodes:  Borderline right hilar nodes, 9 mm greatest dimension. No mediastinal adenopathy. No thyroid nodule. Decompressed esophagus. Lungs/Pleura: Hypoventilatory changes in the dependent lungs. No pleural fluid. No features of pulmonary edema. No pulmonary mass or suspicious nodule. Musculoskeletal: Thoracic spondylosis. No compression fracture or acute findings. No focal bone lesion. No chest wall soft tissue abnormalities. Review of the MIP images confirms the above findings. CTA ABDOMEN AND PELVIS FINDINGS VASCULAR Aorta: Flaring of the infrarenal aorta not meeting criteria for being aneurysmal, maximal dimension 2.8 cm AP (measured on series 11, image 85). Moderate atherosclerosis with calcified and irregular noncalcified atheromatous plaque. No dissection, periaortic stranding, vasculitis or acute findings. Celiac: Patent without evidence of aneurysm, dissection, vasculitis or significant stenosis. SMA: Patent without evidence of aneurysm, dissection, vasculitis or significant stenosis. Renals: Plaque at the origin of the left renal artery with mild stenosis. No stenosis of the right renal artery. No dissection, aneurysm or evidence of fibromuscular dysplasia. IMA: Patent. Inflow: Patent without evidence of aneurysm, dissection, vasculitis or significant stenosis. Mild calcified and noncalcified atheromatous plaque within both common iliac arteries. Veins: No obvious venous abnormality within the limitations of this arterial phase study. Review of the MIP images confirms the above findings. NON-VASCULAR Hepatobiliary: No focal hepatic abnormality on arterial phase imaging. Gallbladder physiologically distended, no calcified stone. No biliary dilatation. Pancreas: No ductal dilatation or inflammation. Spleen: Normal in size and arterial enhancement. Adrenals/Urinary Tract: Normal adrenal glands. No hydronephrosis. No perinephric edema. Unremarkable urinary bladder. Stomach/Bowel: Minimal hiatal hernia. Fluid within the  stomach, no obvious gastric inflammation. No small bowel inflammation or obstruction. Left colonic diverticulosis, no diverticulitis. No acute colonic inflammatory change. Moderate colonic stool burden. The appendix is not definitively seen. Lymphatic: No bulky adenopathy. Reproductive: Normal for age uterine atrophy.  No adnexal mass Other: No free air or ascites. Tiny fat containing umbilical hernia. Musculoskeletal: Advanced L4-L5 degenerative disc disease with anterolisthesis. Additional degenerative disc disease and facet hypertrophy in the lumbar spine to a lesser degree. No acute osseous findings. Review of the MIP images confirms the above findings. IMPRESSION: 1. No aortic aneurysm, dissection or acute aortic abnormality. 2. Diffuse calcified and irregular noncalcified atheromatous plaque throughout the thoracoabdominal aorta. Small ulcerated plaque in the descending aorta. Flaring of the infrarenal abdominal aorta (2.8 cm maximal dimension) does not meet criteria for aneurysm. 3. Left colonic diverticulosis, no diverticulitis. Moderate colonic stool burden, query constipation. Aortic Atherosclerosis (ICD10-I70.0). Electronically Signed   By: MKeith RakeM.D.   On: 08/01/2021 19:31   MR BRAIN WO CONTRAST  Result Date: 08/01/2021 CLINICAL DATA:  Acute onset of right leg weakness and gait instability. EXAM:  MRI HEAD WITHOUT CONTRAST TECHNIQUE: Multiplanar, multiecho pulse sequences of the brain and surrounding structures were obtained without intravenous contrast. COMPARISON:  MR head without contrast 05/27/2020. FINDINGS: Brain: Diffusion-weighted images demonstrate no acute or subacute infarction. Periventricular and subcortical T2 hyperintensities are similar the prior exam, mildly advanced for age. Mild generalized atrophy is present. The ventricles are of normal size. No significant extraaxial fluid collection is present. The internal auditory canals are within normal limits. The brainstem and  cerebellum are within normal limits. Vascular: Flow is present in the major intracranial arteries. Skull and upper cervical spine: Degenerative changes are present the upper cervical spine, most evident at C4-5 with effacement the ventral CSF. Craniocervical junction is normal. Marrow signal is normal. Midline structures are otherwise unremarkable. Sinuses/Orbits: Small left mastoid effusion is present. The paranasal sinuses and mastoid air cells are otherwise clear. Bilateral lens replacements are noted. Globes and orbits are otherwise unremarkable. IMPRESSION: 1. No acute intracranial abnormality or significant interval change. 2. Periventricular and subcortical T2 hyperintensities bilaterally are mildly advanced for age. The finding is nonspecific but can be seen in the setting of chronic microvascular ischemia, a demyelinating process such as multiple sclerosis, vasculitis, complicated migraine headaches, or as the sequelae of a prior infectious or inflammatory process. Electronically Signed   By: San Morelle M.D.   On: 08/01/2021 19:17    Procedures Procedures    Medications Ordered in ED Medications  iohexol (OMNIPAQUE) 350 MG/ML injection 75 mL (75 mLs Intravenous Contrast Given 08/01/21 1911)    ED Course/ Medical Decision Making/ A&P                           Medical Decision Making Amount and/or Complexity of Data Reviewed Labs: ordered. Radiology: ordered.  Risk Prescription drug management.   Horace Wishon is a 86 y.o. female with a past medical history significant for hypertension, previous TIA and stroke, chronic back pain with sciatica who presents with sudden onset severe pain in her upper back, mid back, associated with diaphoresis and nausea followed by right leg numbness and unsteadiness.  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  She says that while she was almost done, it hit her where she started  having severe pain in her mid back and between her shoulder blades and associated with diaphoresis and nausea.  She was denying any pain in her chest or abdomen but felt that something was wrong.  She had to sit down and that relieved some of her back discomfort.  She then was taken home in the back pain continue to improve but then started having right leg numbness and feeling that she had difficulty with ambulation.  She has a history of previous stroke that was on the left side causing some aphasia but has not had any visual symptoms or speech difficulties.  She reports she felt unsteady but was denying any room spinning dizziness or sensation of head spinning.  She denies any trauma.  Denies any headache or neck pain.  Denies any palpitations.  Reports that the numbness in her leg was not associated with pain in the leg as she has had with some sciatic symptoms in the past.  She reports this feels very different.  She does report her blood pressure was higher recently.  Denies any constipation, diarrhea, or urinary symptoms.  Denies fevers, chills, congestion, or cough.  On my initial  arrival, patient had already walked to and from the bathroom without difficulty.  She reports that her symptoms have improved.  She is denying any pain in her chest or abdomen and is denying any persistent numbness in the legs.  On my exam, she had intact and symmetric sensation in her arms and legs.  Intact pulses on my exam.  Patient had no sensory or strength deficit in her legs and had a negative straight leg raise on the right.  Her back was nontender but she describes the pain being aching and sharp between her shoulder blades and in her mid back when all this happened.  Lungs were clear and chest was nontender.  Abdomen was nontender.  No focal neurologic deficits on my exam.  As we discussed, I am hopeful this was more her standing on her feet for long time serving her patrons leading to some back spasm and pain  leading to the nausea, lightheadedness, and some of the transient leg symptoms that could be related to her previous sciatica if she was having back spasm.  However, given her report of a severe 10 out of 10 pain in her shoulder blades and back followed by associated numbness in the leg with the blood pressures being elevated recently, we did discuss that we feel to reasonable to rule out an aortic etiology of symptoms by getting a dissection study.  Due to her history of stroke on the left side of her head, we did feel is reasonable to also get MRI to rule out stroke causing this right-sided symptoms in the leg.  If work-up is reassuring, anticipate discharge home and family agrees.  I spoke with radiology personally about the imaging after reviewing it and although there is some aortic calcifications and ulceration does not appear to have any acute aortic disease contribute to her symptoms.  Other work-up also reassuring.  Patient is feeling much better and has had no further symptoms.  I suspect while holding a tray serving people at church led to muscular pain in her back leading to her episode of lightheadedness.  Had a shared decision-making conversation and she agrees with discharge home with outpatient PCP follow-up.  She will also follow-up for the aorta.  She had other questions or concerns and was discharged in good condition.         Final Clinical Impression(s) / ED Diagnoses Final diagnoses:  Acute thoracic back pain, unspecified back pain laterality  Numbness and tingling    Rx / DC Orders ED Discharge Orders     None       Clinical Impression: 1. Acute thoracic back pain, unspecified back pain laterality   2. Numbness and tingling     Disposition: Discharge  Condition: Good  I have discussed the results, Dx and Tx plan with the pt(& family if present). He/she/they expressed understanding and agree(s) with the plan. Discharge instructions discussed at great  length. Strict return precautions discussed and pt &/or family have verbalized understanding of the instructions. No further questions at time of discharge.    New Prescriptions   No medications on file    Follow Up: Donald Prose, MD [address] [place] [address] [place] [address] [id number] mc [place] [phone]        Arwin Bisceglia, Gwenyth Allegra, MD 08/01/21 2120

## 2021-08-01 NOTE — Discharge Instructions (Signed)
Your history, exam, work-up today did not reveal an acute cause of your symptoms.  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  Your work-up was otherwise reassuring.  We did discover some calcifications and abnormality of your aorta however as we discussed there was no aneurysm or dissection.  Please follow-up with your primary doctor for this.  Again with your MRI of the brain we did not see evidence of acute stroke however as we discussed sometimes TIAs and small abnormalities are missed initially.  Please follow-up with your primary doctor as well.  If any symptoms change or worsen acutely, return to the nearest emergency room.  Please rest and stay hydrated.

## 2021-08-01 NOTE — ED Triage Notes (Signed)
Pt was at church earlier today and became nauseous and diaphoretic, resolved after sitting down and resting. Pt has complaints of tingling to her right leg and pain in between her shoulder blades, reports hx of sciatica.   BP 160/80

## 2021-08-01 NOTE — ED Notes (Signed)
Urine culture sent down with UA. 

## 2021-09-22 DIAGNOSIS — H524 Presbyopia: Secondary | ICD-10-CM | POA: Diagnosis not present

## 2021-09-22 DIAGNOSIS — Z961 Presence of intraocular lens: Secondary | ICD-10-CM | POA: Diagnosis not present

## 2021-09-22 DIAGNOSIS — H43813 Vitreous degeneration, bilateral: Secondary | ICD-10-CM | POA: Diagnosis not present

## 2021-10-07 DIAGNOSIS — G720 Drug-induced myopathy: Secondary | ICD-10-CM | POA: Diagnosis not present

## 2021-10-07 DIAGNOSIS — Z0000 Encounter for general adult medical examination without abnormal findings: Secondary | ICD-10-CM | POA: Diagnosis not present

## 2021-10-07 DIAGNOSIS — E785 Hyperlipidemia, unspecified: Secondary | ICD-10-CM | POA: Diagnosis not present

## 2021-10-07 DIAGNOSIS — I10 Essential (primary) hypertension: Secondary | ICD-10-CM | POA: Diagnosis not present

## 2021-10-07 DIAGNOSIS — I700 Atherosclerosis of aorta: Secondary | ICD-10-CM | POA: Diagnosis not present

## 2021-10-07 DIAGNOSIS — Z23 Encounter for immunization: Secondary | ICD-10-CM | POA: Diagnosis not present

## 2021-10-07 DIAGNOSIS — L989 Disorder of the skin and subcutaneous tissue, unspecified: Secondary | ICD-10-CM | POA: Diagnosis not present

## 2021-10-07 DIAGNOSIS — Z8673 Personal history of transient ischemic attack (TIA), and cerebral infarction without residual deficits: Secondary | ICD-10-CM | POA: Diagnosis not present

## 2021-10-07 DIAGNOSIS — M810 Age-related osteoporosis without current pathological fracture: Secondary | ICD-10-CM | POA: Diagnosis not present

## 2021-10-07 DIAGNOSIS — Z1331 Encounter for screening for depression: Secondary | ICD-10-CM | POA: Diagnosis not present

## 2021-10-28 DIAGNOSIS — D485 Neoplasm of uncertain behavior of skin: Secondary | ICD-10-CM | POA: Diagnosis not present

## 2021-10-28 DIAGNOSIS — C44329 Squamous cell carcinoma of skin of other parts of face: Secondary | ICD-10-CM | POA: Diagnosis not present

## 2021-11-18 DIAGNOSIS — C411 Malignant neoplasm of mandible: Secondary | ICD-10-CM | POA: Diagnosis not present

## 2021-11-26 DIAGNOSIS — C44329 Squamous cell carcinoma of skin of other parts of face: Secondary | ICD-10-CM | POA: Diagnosis not present

## 2021-12-17 DIAGNOSIS — Z5189 Encounter for other specified aftercare: Secondary | ICD-10-CM | POA: Diagnosis not present

## 2021-12-17 DIAGNOSIS — Z8589 Personal history of malignant neoplasm of other organs and systems: Secondary | ICD-10-CM | POA: Diagnosis not present

## 2022-04-07 DIAGNOSIS — G720 Drug-induced myopathy: Secondary | ICD-10-CM | POA: Diagnosis not present

## 2022-04-07 DIAGNOSIS — E785 Hyperlipidemia, unspecified: Secondary | ICD-10-CM | POA: Diagnosis not present

## 2022-04-07 DIAGNOSIS — I700 Atherosclerosis of aorta: Secondary | ICD-10-CM | POA: Diagnosis not present

## 2022-04-07 DIAGNOSIS — I10 Essential (primary) hypertension: Secondary | ICD-10-CM | POA: Diagnosis not present

## 2022-09-28 DIAGNOSIS — Z961 Presence of intraocular lens: Secondary | ICD-10-CM | POA: Diagnosis not present

## 2022-09-28 DIAGNOSIS — H524 Presbyopia: Secondary | ICD-10-CM | POA: Diagnosis not present

## 2022-11-01 DIAGNOSIS — Z23 Encounter for immunization: Secondary | ICD-10-CM | POA: Diagnosis not present

## 2022-11-01 DIAGNOSIS — I10 Essential (primary) hypertension: Secondary | ICD-10-CM | POA: Diagnosis not present

## 2022-11-01 DIAGNOSIS — I700 Atherosclerosis of aorta: Secondary | ICD-10-CM | POA: Diagnosis not present

## 2022-11-01 DIAGNOSIS — G720 Drug-induced myopathy: Secondary | ICD-10-CM | POA: Diagnosis not present

## 2022-11-01 DIAGNOSIS — Z1331 Encounter for screening for depression: Secondary | ICD-10-CM | POA: Diagnosis not present

## 2022-11-01 DIAGNOSIS — E785 Hyperlipidemia, unspecified: Secondary | ICD-10-CM | POA: Diagnosis not present

## 2022-11-01 DIAGNOSIS — Z8673 Personal history of transient ischemic attack (TIA), and cerebral infarction without residual deficits: Secondary | ICD-10-CM | POA: Diagnosis not present

## 2022-11-01 DIAGNOSIS — M810 Age-related osteoporosis without current pathological fracture: Secondary | ICD-10-CM | POA: Diagnosis not present

## 2022-11-01 DIAGNOSIS — Z0000 Encounter for general adult medical examination without abnormal findings: Secondary | ICD-10-CM | POA: Diagnosis not present

## 2023-04-02 IMAGING — MR MR HEAD W/O CM
10 series · 48 of 48 positions shown · non-contrast
Comparison: Brain MRI 05/15/2016.

CLINICAL DATA: Worsen hand writing. History of TIA. Confusion.
Additional history provided by scanning technologist: Patient
reports worsening hand writing since [place] (right hand).

EXAM:
MRI HEAD WITHOUT CONTRAST
TECHNIQUE: Multiplanar, multiecho pulse sequences of the brain and surrounding
structures were obtained without intravenous contrast.

[Series 2: T1 · sagittal · 5.0mm · 0.45mm/px · 3 of 25 slices shown]
[im 1/25]
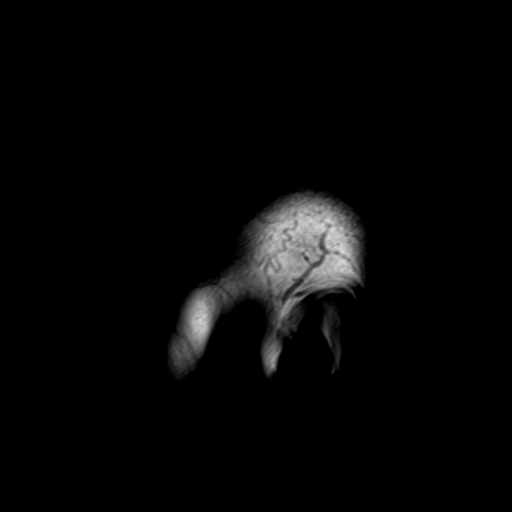
[im 13/25]
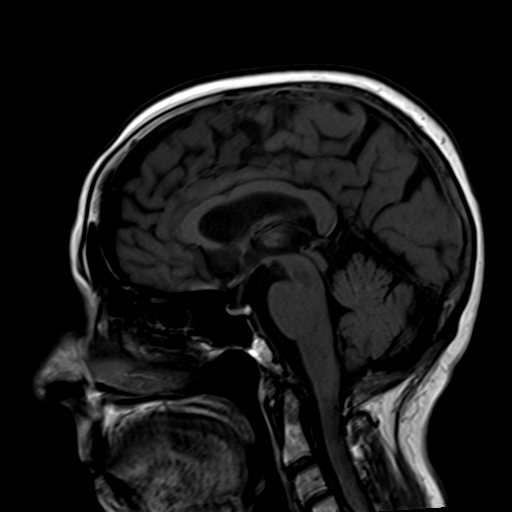
[im 25/25]
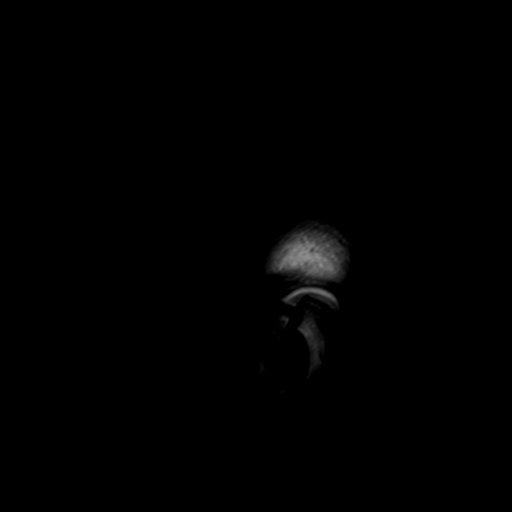

[Series 3: ax ep2d_diff_3 · axial · 3.0mm · 1.80mm/px · z∈[-63,+85]mm · 8 of 101 slices shown]
[im 1/101]
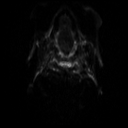
[im 15/101]
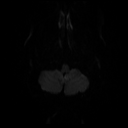
[im 29/101]
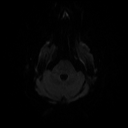
[im 43/101]
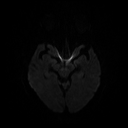
[im 58/101]
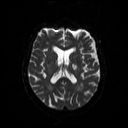
[im 72/101]
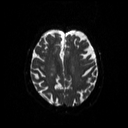
[im 86/101]
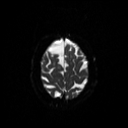
[im 101/101]
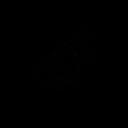

[Series 4: ax ep2d_diff_3_adc · axial · 3.0mm · 1.80mm/px · z∈[-63,+85]mm · 4 of 51 slices shown]
[im 1/51]
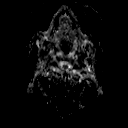
[im 17/51]
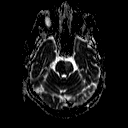
[im 34/51]
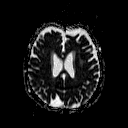
[im 51/51]
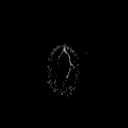

[Series 5: cor ep2d_diff · coronal · 5.0mm · 1.77mm/px · 5 of 59 slices shown]
[im 1/59]
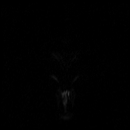
[im 15/59]
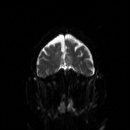
[im 30/59]
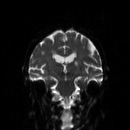
[im 44/59]
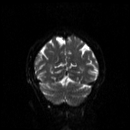
[im 59/59]
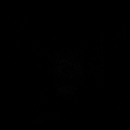

[Series 6: cor ep2d_diff_adc · coronal · 5.0mm · 1.77mm/px · 2 of 30 slices shown]
[im 1/30]
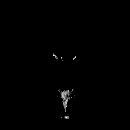
[im 30/30]
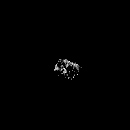

[Series 8: swi_images · axial · 2.0mm · 0.98mm/px · z∈[-68,+89]mm · 6 of 80 slices shown]
[im 1/80]
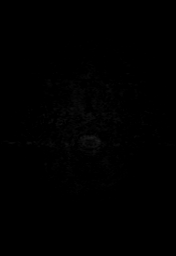
[im 16/80]
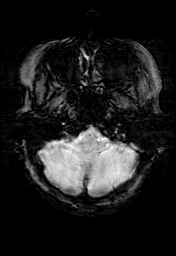
[im 32/80]
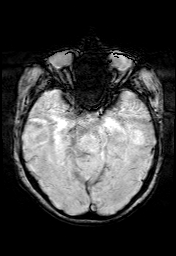
[im 48/80]
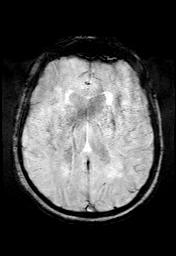
[im 64/80]
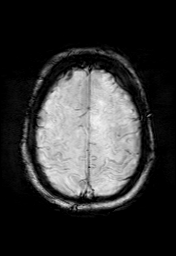
[im 80/80]
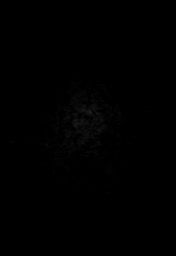

[Series 9: FLAIR · axial · 3.0mm · 0.43mm/px · z∈[-62,+92]mm · 3 of 41 slices shown]
[im 1/41]
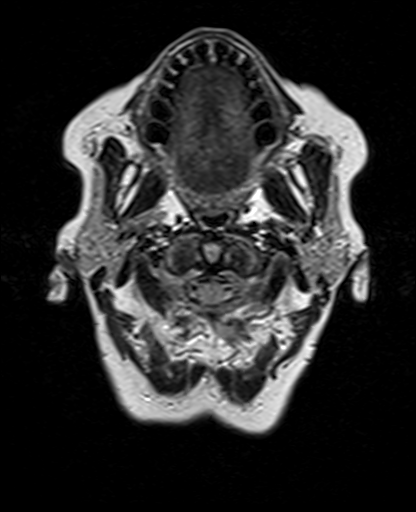
[im 21/41]
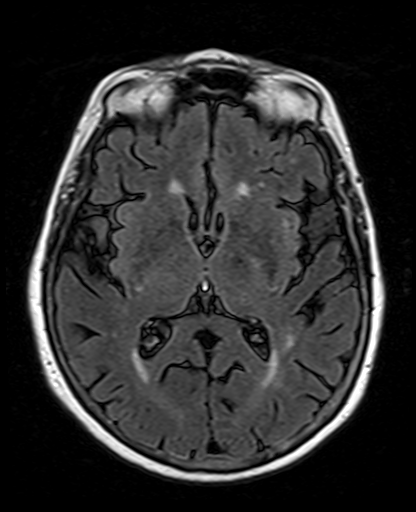
[im 41/41]
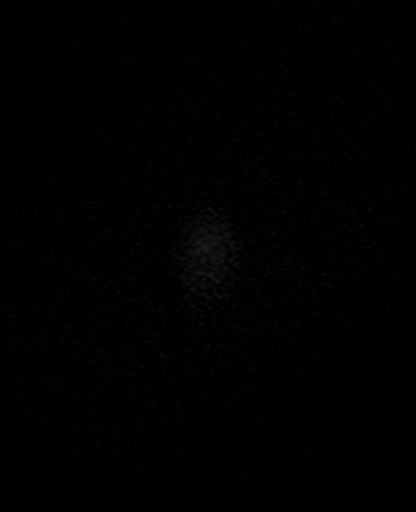

[Series 10: T2 · axial · 5.0mm · 0.65mm/px · z∈[-67,+87]mm · 2 of 27 slices shown (1 of 2)]
[im 1/27]
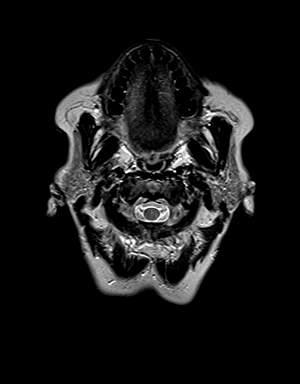
[im 27/27]
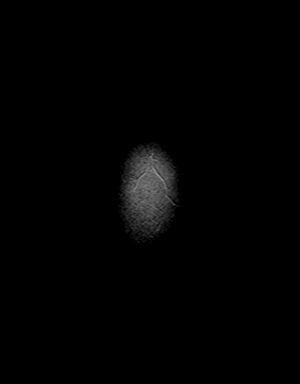

[Series 11: t1_mpr_tra · axial · 1.0mm · 0.72mm/px · z∈[-67,+90]mm · 13 of 160 slices shown]
[im 1/160]
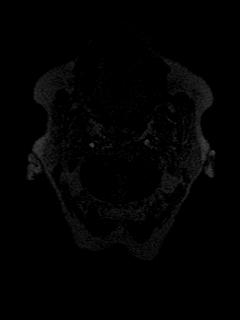
[im 14/160]
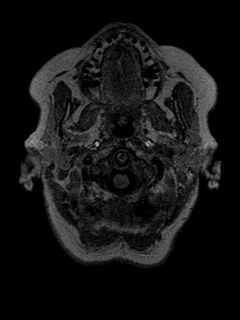
[im 27/160]
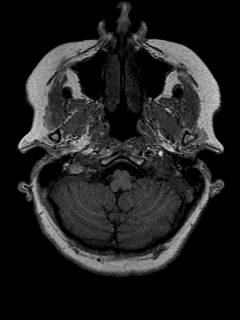
[im 40/160]
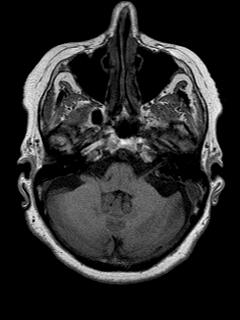
[im 54/160]
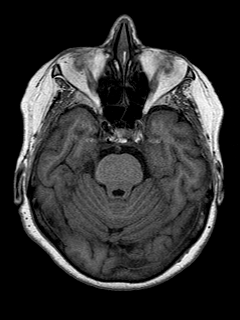
[im 67/160]
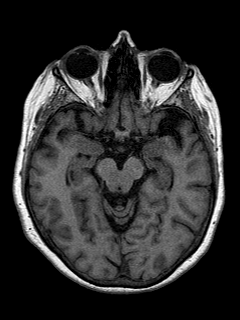
[im 80/160]
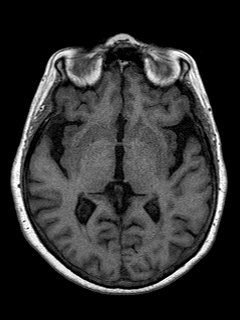
[im 93/160]
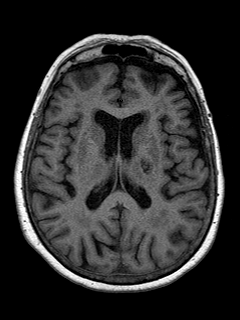
[im 107/160]
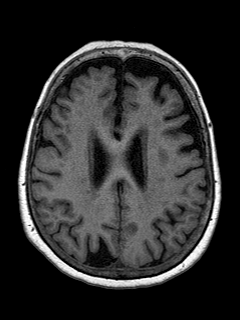
[im 120/160]
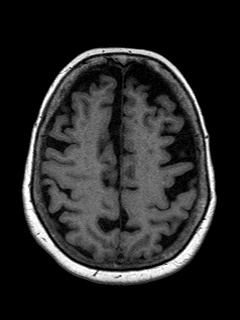
[im 133/160]
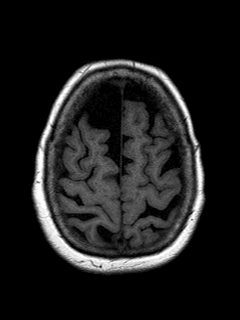
[im 146/160]
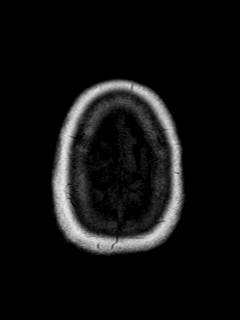
[im 160/160]
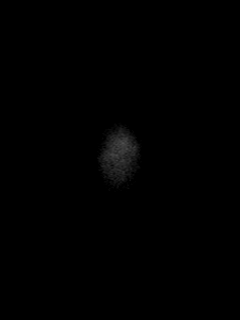

[Series 12: T2 · coronal · 5.0mm · 0.45mm/px · 2 of 31 slices shown (2 of 2)]
[im 1/31]
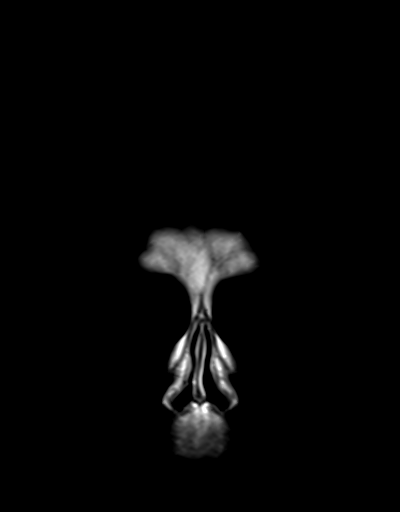
[im 31/31]
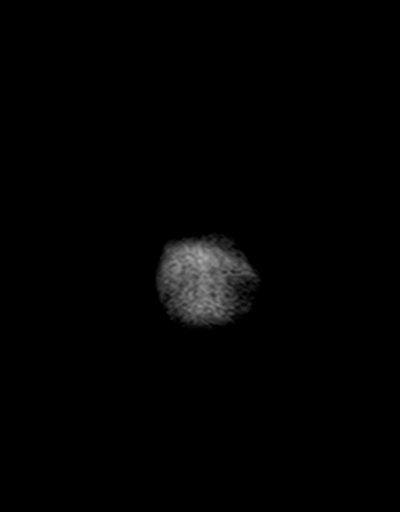

[48 of 48 positions shown; findings below may reference images not displayed]

FINDINGS: Brain:

Mild cerebral and cerebellar atrophy.

13 mm lacunar infarct within the posterior limb of the left internal
capsule. There is corresponding diffusion weighted hyperintensity at
this site. Portions of the infarct demonstrate T2 shine through on
the ADC map, with other portions showing intermediate signal, and
findings are compatible with a subacute infarct.

Background mild multifocal T2/FLAIR hyperintensity within the
cerebral white matter and right cerebellar white matter, nonspecific
but compatible with chronic small vessel ischemic disease.

No evidence of intracranial mass.

No chronic intracranial blood products.

No extra-axial fluid collection.

No midline shift.

Vascular: Expected proximal arterial flow voids.

Skull and upper cervical spine: No focal marrow lesion.

Sinuses/Orbits: Visualized orbits show no acute finding. Bilateral
lens replacements. No significant paranasal sinus disease.

These results will be called to the ordering clinician or
representative by the Radiologist Assistant, and communication
documented in the PACS or [REDACTED].
IMPRESSION: 13 mm subacute infarct within the posterior limb of the left
internal capsule.

Background mild cerebral atrophy and chronic small vessel ischemic
disease, slightly progressed from the brain MRI of 05/15/2016.

## 2023-05-04 DIAGNOSIS — Z8673 Personal history of transient ischemic attack (TIA), and cerebral infarction without residual deficits: Secondary | ICD-10-CM | POA: Diagnosis not present

## 2023-05-04 DIAGNOSIS — I10 Essential (primary) hypertension: Secondary | ICD-10-CM | POA: Diagnosis not present

## 2023-05-04 DIAGNOSIS — G720 Drug-induced myopathy: Secondary | ICD-10-CM | POA: Diagnosis not present

## 2023-05-04 DIAGNOSIS — J069 Acute upper respiratory infection, unspecified: Secondary | ICD-10-CM | POA: Diagnosis not present

## 2023-05-04 DIAGNOSIS — I700 Atherosclerosis of aorta: Secondary | ICD-10-CM | POA: Diagnosis not present

## 2023-05-04 DIAGNOSIS — E785 Hyperlipidemia, unspecified: Secondary | ICD-10-CM | POA: Diagnosis not present

## 2023-06-23 DIAGNOSIS — N3000 Acute cystitis without hematuria: Secondary | ICD-10-CM | POA: Diagnosis not present

## 2023-09-30 DIAGNOSIS — H43813 Vitreous degeneration, bilateral: Secondary | ICD-10-CM | POA: Diagnosis not present

## 2023-09-30 DIAGNOSIS — H524 Presbyopia: Secondary | ICD-10-CM | POA: Diagnosis not present

## 2023-09-30 DIAGNOSIS — Z961 Presence of intraocular lens: Secondary | ICD-10-CM | POA: Diagnosis not present

## 2023-11-23 DIAGNOSIS — R21 Rash and other nonspecific skin eruption: Secondary | ICD-10-CM | POA: Diagnosis not present

## 2023-12-02 DIAGNOSIS — I129 Hypertensive chronic kidney disease with stage 1 through stage 4 chronic kidney disease, or unspecified chronic kidney disease: Secondary | ICD-10-CM | POA: Diagnosis not present

## 2023-12-02 DIAGNOSIS — G720 Drug-induced myopathy: Secondary | ICD-10-CM | POA: Diagnosis not present

## 2023-12-02 DIAGNOSIS — E785 Hyperlipidemia, unspecified: Secondary | ICD-10-CM | POA: Diagnosis not present

## 2023-12-02 DIAGNOSIS — M810 Age-related osteoporosis without current pathological fracture: Secondary | ICD-10-CM | POA: Diagnosis not present

## 2023-12-02 DIAGNOSIS — Z1331 Encounter for screening for depression: Secondary | ICD-10-CM | POA: Diagnosis not present

## 2023-12-02 DIAGNOSIS — R21 Rash and other nonspecific skin eruption: Secondary | ICD-10-CM | POA: Diagnosis not present

## 2023-12-02 DIAGNOSIS — Z0000 Encounter for general adult medical examination without abnormal findings: Secondary | ICD-10-CM | POA: Diagnosis not present

## 2023-12-02 DIAGNOSIS — N1831 Chronic kidney disease, stage 3a: Secondary | ICD-10-CM | POA: Diagnosis not present

## 2023-12-02 DIAGNOSIS — I700 Atherosclerosis of aorta: Secondary | ICD-10-CM | POA: Diagnosis not present

## 2023-12-02 DIAGNOSIS — Z23 Encounter for immunization: Secondary | ICD-10-CM | POA: Diagnosis not present

## 2023-12-02 DIAGNOSIS — I10 Essential (primary) hypertension: Secondary | ICD-10-CM | POA: Diagnosis not present

## 2023-12-02 DIAGNOSIS — Z8673 Personal history of transient ischemic attack (TIA), and cerebral infarction without residual deficits: Secondary | ICD-10-CM | POA: Diagnosis not present
